# Patient Record
Sex: Female | Born: 1985 | Race: Black or African American | Hispanic: No | Marital: Single | Smoking: Former smoker
Health system: Southern US, Community
[De-identification: ages and names within clinical notes are randomized; demographics above are authoritative.]

## PROBLEM LIST (undated history)

## (undated) ENCOUNTER — Inpatient Hospital Stay (HOSPITAL_COMMUNITY): Payer: Self-pay

## (undated) DIAGNOSIS — N6009 Solitary cyst of unspecified breast: Secondary | ICD-10-CM

## (undated) DIAGNOSIS — L309 Dermatitis, unspecified: Secondary | ICD-10-CM

## (undated) DIAGNOSIS — F419 Anxiety disorder, unspecified: Secondary | ICD-10-CM

## (undated) DIAGNOSIS — R87629 Unspecified abnormal cytological findings in specimens from vagina: Secondary | ICD-10-CM

## (undated) HISTORY — DX: Solitary cyst of unspecified breast: N60.09

## (undated) HISTORY — DX: Unspecified abnormal cytological findings in specimens from vagina: R87.629

## (undated) HISTORY — PX: OTHER SURGICAL HISTORY: SHX169

---
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
## 2007-09-06 ENCOUNTER — Emergency Department (HOSPITAL_COMMUNITY): Admission: EM | Admit: 2007-09-06 | Discharge: 2007-09-06 | Payer: Self-pay | Admitting: Emergency Medicine

## 2008-02-12 HISTORY — PX: THERAPEUTIC ABORTION: SHX798

## 2008-09-10 ENCOUNTER — Emergency Department (HOSPITAL_COMMUNITY): Admission: EM | Admit: 2008-09-10 | Discharge: 2008-09-10 | Payer: Self-pay | Admitting: Emergency Medicine

## 2009-08-22 ENCOUNTER — Emergency Department (HOSPITAL_COMMUNITY): Admission: EM | Admit: 2009-08-22 | Discharge: 2009-08-23 | Payer: Self-pay | Admitting: Emergency Medicine

## 2009-11-02 ENCOUNTER — Inpatient Hospital Stay (HOSPITAL_COMMUNITY): Admission: AD | Admit: 2009-11-02 | Discharge: 2009-11-02 | Payer: Self-pay | Admitting: Obstetrics & Gynecology

## 2010-01-01 ENCOUNTER — Inpatient Hospital Stay (HOSPITAL_COMMUNITY)
Admission: AD | Admit: 2010-01-01 | Discharge: 2010-01-01 | Payer: Self-pay | Source: Home / Self Care | Admitting: Obstetrics and Gynecology

## 2010-01-30 ENCOUNTER — Ambulatory Visit (HOSPITAL_COMMUNITY): Admission: RE | Admit: 2010-01-30 | Payer: Self-pay | Source: Home / Self Care | Admitting: Obstetrics and Gynecology

## 2010-03-04 ENCOUNTER — Encounter: Payer: Self-pay | Admitting: Obstetrics and Gynecology

## 2010-04-07 ENCOUNTER — Inpatient Hospital Stay (HOSPITAL_COMMUNITY)
Admission: AD | Admit: 2010-04-07 | Discharge: 2010-04-07 | Disposition: A | Discharge status: Home / Self Care | Payer: Medicaid Other | Source: Ambulatory Visit | Attending: Obstetrics and Gynecology | Admitting: Obstetrics and Gynecology

## 2010-04-07 DIAGNOSIS — O99891 Other specified diseases and conditions complicating pregnancy: Secondary | ICD-10-CM | POA: Insufficient documentation

## 2010-04-07 DIAGNOSIS — G988 Other disorders of nervous system: Secondary | ICD-10-CM | POA: Insufficient documentation

## 2010-04-07 DIAGNOSIS — G5600 Carpal tunnel syndrome, unspecified upper limb: Secondary | ICD-10-CM | POA: Insufficient documentation

## 2010-04-15 ENCOUNTER — Inpatient Hospital Stay (HOSPITAL_COMMUNITY): Payer: Medicaid Other

## 2010-04-15 ENCOUNTER — Inpatient Hospital Stay (HOSPITAL_COMMUNITY)
Admission: RE | Admit: 2010-04-15 | Discharge: 2010-04-15 | Disposition: A | Discharge status: Home / Self Care | Payer: Medicaid Other | Source: Ambulatory Visit | Attending: Obstetrics and Gynecology | Admitting: Obstetrics and Gynecology

## 2010-04-15 DIAGNOSIS — O479 False labor, unspecified: Secondary | ICD-10-CM | POA: Insufficient documentation

## 2010-04-16 ENCOUNTER — Inpatient Hospital Stay (HOSPITAL_COMMUNITY)
Admission: AD | Admit: 2010-04-16 | Discharge: 2010-04-20 | DRG: 765 | Disposition: A | Discharge status: Home / Self Care | Payer: Medicaid Other | Source: Ambulatory Visit | Attending: Obstetrics and Gynecology | Admitting: Obstetrics and Gynecology

## 2010-04-16 DIAGNOSIS — D696 Thrombocytopenia, unspecified: Secondary | ICD-10-CM | POA: Diagnosis present

## 2010-04-16 DIAGNOSIS — O9902 Anemia complicating childbirth: Secondary | ICD-10-CM | POA: Diagnosis present

## 2010-04-16 DIAGNOSIS — D649 Anemia, unspecified: Secondary | ICD-10-CM | POA: Diagnosis present

## 2010-04-16 DIAGNOSIS — D689 Coagulation defect, unspecified: Secondary | ICD-10-CM | POA: Diagnosis present

## 2010-04-16 DIAGNOSIS — O99892 Other specified diseases and conditions complicating childbirth: Secondary | ICD-10-CM | POA: Diagnosis present

## 2010-04-16 DIAGNOSIS — D573 Sickle-cell trait: Secondary | ICD-10-CM | POA: Diagnosis present

## 2010-04-16 DIAGNOSIS — Z22330 Carrier of Group B streptococcus: Secondary | ICD-10-CM

## 2010-04-17 LAB — CBC
HCT: 31.6 % — ABNORMAL LOW (ref 36.0–46.0)
MCH: 28.6 pg (ref 26.0–34.0)
MCHC: 33.5 g/dL (ref 30.0–36.0)
MCV: 85.2 fL (ref 78.0–100.0)
RDW: 14.1 % (ref 11.5–15.5)
WBC: 6.8 10*3/uL (ref 4.0–10.5)

## 2010-04-18 LAB — CBC
MCH: 28.4 pg (ref 26.0–34.0)
MCHC: 33.2 g/dL (ref 30.0–36.0)
MCV: 85.6 fL (ref 78.0–100.0)
Platelets: 109 10*3/uL — ABNORMAL LOW (ref 150–400)
RBC: 2.64 MIL/uL — ABNORMAL LOW (ref 3.87–5.11)

## 2010-04-19 LAB — CBC
HCT: 21.9 % — ABNORMAL LOW (ref 36.0–46.0)
MCHC: 33.3 g/dL (ref 30.0–36.0)
RDW: 14.3 % (ref 11.5–15.5)

## 2010-04-24 LAB — CBC
MCH: 32.1 pg (ref 26.0–34.0)
MCV: 93.3 fL (ref 78.0–100.0)
Platelets: 154 10*3/uL (ref 150–400)
RDW: 14.2 % (ref 11.5–15.5)

## 2010-04-24 LAB — COMPREHENSIVE METABOLIC PANEL
Albumin: 3.2 g/dL — ABNORMAL LOW (ref 3.5–5.2)
BUN: 4 mg/dL — ABNORMAL LOW (ref 6–23)
Creatinine, Ser: 0.65 mg/dL (ref 0.4–1.2)
Total Protein: 6.5 g/dL (ref 6.0–8.3)

## 2010-04-24 LAB — DIFFERENTIAL
Basophils Absolute: 0 10*3/uL (ref 0.0–0.1)
Lymphocytes Relative: 11 % — ABNORMAL LOW (ref 12–46)
Monocytes Absolute: 0.6 10*3/uL (ref 0.1–1.0)
Monocytes Relative: 7 % (ref 3–12)
Neutro Abs: 6.5 10*3/uL (ref 1.7–7.7)

## 2010-04-24 LAB — URINALYSIS, ROUTINE W REFLEX MICROSCOPIC
Bilirubin Urine: NEGATIVE
Hgb urine dipstick: NEGATIVE
Protein, ur: NEGATIVE mg/dL
Urobilinogen, UA: 0.2 mg/dL (ref 0.0–1.0)

## 2010-04-26 LAB — ABO/RH: ABO/RH(D): O POS

## 2010-04-26 LAB — URINALYSIS, ROUTINE W REFLEX MICROSCOPIC
Bilirubin Urine: NEGATIVE
Hgb urine dipstick: NEGATIVE
Nitrite: NEGATIVE
Specific Gravity, Urine: 1.015 (ref 1.005–1.030)
pH: 6 (ref 5.0–8.0)

## 2010-04-28 NOTE — Op Note (Signed)
Emily Garrison, Emily Garrison          ACCOUNT NO.:  [id number]  MEDICAL RECORD NO.:  [id number]           PATIENT TYPE:  I  LOCATION:  9117                          FACILITY:  [facility]  PHYSICIAN:  Hal Morales, M.D.DATE OF BIRTH:  10/18/1985  DATE OF PROCEDURE:  04/17/2010 DATE OF DISCHARGE:                              OPERATIVE REPORT   PREOPERATIVE DIAGNOSIS:  Intrauterine pregnancy at 40-1/2 weeks, failure to progress in labor, meconium-stained amniotic fluid.  POSTOPERATIVE DIAGNOSES:  Intrauterine pregnancy at 40-1/2 weeks, failure to progress in labor, meconium-stained amniotic fluid plus occiput posterior position.  PROCEDURE:  Primary low transverse cesarean section.  SURGEON:  Hal Morales, MD  FIRST ASSISTANT:  Larna Daughters, CNM  ANESTHESIA:  Epidural.  ESTIMATED BLOOD LOSS:  750 mL.  COMPLICATIONS:  None.  FINDINGS:  The patient was delivered of a female infant weighing 7 pounds 15 ounces with Apgar's of 8 and 9 at 1 and 5 minutes respectively.  The placenta contained an eccentrically inserted 3-vessel cord.  The uterus, tubes, and ovaries were normal for gravid state.  PROCEDURE:  The patient was taken to the operating room after appropriate identification and after discussion of the indication for her procedure which is the failure to continue dilating during the active phase of labor in spite of adequate contractions.  The risks of anesthesia, bleeding, infection, and damage to adjacent organs were all reviewed and the patient wished to proceed.  She was placed on the operating table with her labor epidural and Foley catheter in place. The abdomen was prepped with ChloraPrep and after waiting the appropriate 3 minutes was draped as a sterile field.  After assurance of adequate surgical anesthesia, the suprapubic region was infiltrated with 20 mL of 0.25% Marcaine.  A suprapubic incision was made and the abdomen opened in layers.  Peritoneum  was entered and the bladder blade placed. The uterus was incised approximately 2 cm above the uterovesical fold and that incision taken laterally on either side bluntly.  The infant was delivered from the occiput posterior position and after having the cord clamped, cut and clamped was handed off to the awaiting pediatricians.  The uterus was massaged and the placenta allowed to separate from the uterus and was removed from the operative field for collection of blood for cord blood donation to [facility].  The uterine incision was closed with a running interlocking suture of 0 Vicryl and imbricating suture of 0 Vicryl was placed with adequate hemostasis.  Copious irrigation was carried out.  The abdominal peritoneum was closed with running suture of 2-0 Vicryl.  The rectus muscles were reapproximated in the midline with figure-of-eight suture of 2-0 Vicryl.  The rectus fascia was closed with running suture of 2-0 Vicryl and reinforced on either side of midline with figure-of-eight sutures of 0 Vicryl.  The subcutaneous tissue was irrigated and made hemostatic with Bovie cautery.  A subcuticular suture of 3-0 Monocryl was used to close the skin incision.  A sterile dressing was applied. The patient was taken from the operating room to the recovery room in satisfactory condition having tolerated the procedure well with sponge  and instrument counts correct.  The infant went to the full-term nursery.     Hal Morales, M.D.     VPH/MEDQ  D:  04/17/2010  T:  04/18/2010  Job:  [id number]  Electronically Signed by Dierdre Forth M.D. on 04/28/2010 07:23:10 PM

## 2010-04-29 LAB — URINALYSIS, ROUTINE W REFLEX MICROSCOPIC
Glucose, UA: NEGATIVE mg/dL
Nitrite: NEGATIVE
Protein, ur: NEGATIVE mg/dL

## 2010-04-29 LAB — HCG, QUANTITATIVE, PREGNANCY: hCG, Beta Chain, Quant, S: 97082 m[IU]/mL — ABNORMAL HIGH (ref <5)

## 2010-04-29 LAB — WET PREP, GENITAL

## 2010-04-29 LAB — POCT PREGNANCY, URINE: Preg Test, Ur: POSITIVE

## 2010-05-20 LAB — URINALYSIS, ROUTINE W REFLEX MICROSCOPIC
Nitrite: NEGATIVE
Protein, ur: NEGATIVE mg/dL
Specific Gravity, Urine: 1.01 (ref 1.005–1.030)
Urobilinogen, UA: 0.2 mg/dL (ref 0.0–1.0)

## 2010-05-20 LAB — CBC
Platelets: 223 10*3/uL (ref 150–400)
RBC: 3.54 MIL/uL — ABNORMAL LOW (ref 3.87–5.11)
WBC: 4.1 10*3/uL (ref 4.0–10.5)

## 2010-05-20 LAB — WET PREP, GENITAL
Trich, Wet Prep: NONE SEEN
WBC, Wet Prep HPF POC: NONE SEEN
Yeast Wet Prep HPF POC: NONE SEEN

## 2010-05-20 LAB — POCT PREGNANCY, URINE: Preg Test, Ur: NEGATIVE

## 2010-05-20 LAB — COMPREHENSIVE METABOLIC PANEL
ALT: 10 U/L (ref 0–35)
AST: 17 U/L (ref 0–37)
Albumin: 3.7 g/dL (ref 3.5–5.2)
CO2: 25 mEq/L (ref 19–32)
Chloride: 108 mEq/L (ref 96–112)
GFR calc Af Amer: >60 mL/min (ref >60)
GFR calc non Af Amer: >60 mL/min (ref >60)
Sodium: 137 mEq/L (ref 135–145)
Total Bilirubin: 0.6 mg/dL (ref 0.3–1.2)

## 2010-06-02 NOTE — Discharge Summary (Signed)
Emily Garrison, Emily Garrison          ACCOUNT NO.:  [id number]  MEDICAL RECORD NO.:  [id number]           PATIENT TYPE:  I  LOCATION:  9117                          [facility]:  [facility]  PHYSICIAN:  Crist Fat. Truc Winfree, M.D. DATE OF BIRTH:  Oct 09, 1985  DATE OF ADMISSION:  04/16/2010 DATE OF DISCHARGE:  04/20/2010                              DISCHARGE SUMMARY   HOSPITAL PROCEDURES: 1. Epidural anesthesia. 2. Primary low transverse cesarean section for failure to progress.  ADMITTING DIAGNOSES: 1. Intrauterine pregnancy at 40 weeks and 6 days. 2. Category 1 fetal heart tracing. 3. 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 4. Group B streptococcus positive in urine. 5. Smoker.  DISCHARGE DIAGNOSES: 1. Stable postoperative day #3 status post primary low transverse     cesarean section for failure to progress (occiput posterior     presentation). 2. Breast-feeding. 3. Persisting postpartum anemia without signs or symptoms. 4. Thrombocytopenia since admission. 5. Obese.  HOSPITAL COURSE:  [name] is a 25 year old gravida 2, para 0-0-1- 0 who presented on date of admission at 40 weeks and 6 days, initially to MAU, reporting strong uterine contractions times approximately 23 hours.  She was then seen in MAU previous night and vaginal exam was 2 cm, 50% effaced per Sanda Klein, certified nurse midwife and again in the office by Dr. Stefano Gaul, his exam was approximately 3 cm.  She reported good fetal movement.  Denied leakage of fluid, had been followed by the MD Service at [facility] with Dr. Stefano Gaul as her primary. Her pregnancy had been remarkable for as I mentioned: 1. 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 2. Late to care. 3. Smoker. 4. Anemia.  Around 11:15 p.m. on the night of April 16, 2010, fetal heart tracing was category 1, baseline 130s.  Toco showed uterine contractions every 4-8 minutes which were mild-to-moderate.  Vaginal exam 2-3  cm, 70% effaced, -3 station vertex.  No leakage of fluid or vaginal bleeding and exam was by Dorathy Kinsman, certified nurse midwife.  Diagnosed with prodrome versus early labor.  Initially offered recheck in 1-2 hours versus pain meds and discharged home, and the patient did request at that time pain medications.  She was to receive some Stadol and Phenergan IM and discharge home 20 minutes after medication if fetal heart tracing stable.  However, the patient's cervix did change on recheck around 2:25 a.m. on April 17, 2010.  Cervix was 4 cm, 80% effaced, -3, vertex, bulging bag of water with positive bloody show and per consult with Dr. Estanislado Pandy, the patient was then admitted to birthing suites with routine orders, penicillin for GBS and analgesia anesthesia p.r.n.  On the morning of April 17, 2010, the patient was seen by Dr. Pennie Rushing.  She complained of pain that was 9/10, but the patient appeared comfortable between contractions.  Contractions were every 2-5 minutes and Pitocin had been started and was at 4 milliunits per minute.  Reassuring fetal heart tracing.  Diagnosed with latent phase of labor and plan was made to recheck later in the morning for possible AROM.  Around 11:10 a.m., the patient had gotten  an epidural and was comfortable.  She was afebrile.  Vital signs stable.  Cervix was 3-4 cm, 80% effaced, -2 station and vertex.  Pitocin remained at 4 milliunits per minute. Artificial rupture of membranes was performed at that time by Dr. Dierdre Forth.  Light meconium stained fluid was noted and IUPC was inserted.  Around 1400 on the afternoon of April 17, 2010, the patient remained comfortable.  Cervix was 5-1/2 cm by nurse exam.  Pitocin continued as the evening progressed.  At approximately 1810, cervix was 6 cm, had been that way for the last 3 hours despite adequate contractions, measured by IUPC.  Cervix had become edematous and C- section was recommended by Dr. Pennie Rushing.   Risks, benefits, and alternatives were reviewed with the patient and family including but not limited to risk of bleeding, infection, and damage to surrounding organs, and anesthesia complications.  All questions were answered and the patient was agreeable to proceed with cesarean section for delivery secondary to failure to progress.  The patient was prepped and taken to the OR where a primary low transverse cesarean section was performed under epidural anesthesia by attending, Dr. Dierdre Forth, assisted by Larna Daughters, certified nurse midwife.  A viable female infant was delivered on April 17, 2010, at 1851 p.m. at 40 weeks and 6 days, direct OP presentation was noted.  Birth weight 7 pounds 15 ounces (3620 g, length 21.5 inches, and Apgars were 8 at 1 minute and 9 at 5 minutes).  Infant was taken to full-time nursery in stable condition and the patient tolerated the procedure well and was transferred to PACU and subsequently to the mother baby floor in stable condition.  On postop day #1 which was April 18, 2010, on morning rounds, the patient complained of being unable to void following Foley discontinuation.  No itching and no nausea, vomiting.  Vital signs were stable.  Physical exam was within normal limits.  Incision dressing was clean, dry, and intact.  Her CBC on postop day #1, white count was up to 10.9 from 6.8, hemoglobin down to 7.5 from 10.6, hematocrit down to 22.6 from 31.6, and platelets were down to 109 from 140 on admission.  She also had an RPR on April 17, 2010, on her date of admission which was non-reactive.   Orthostatic vital signs were ordered and continue to observe for spontaneous void.  Around 11 a.m. which was about a hour after seen by Dr. Normand Sloop, the patient was doing well.  Pain was well controlled. She was breast-feeding, positive flatus, still had not voided, but physical exam remained within normal limits.  Orthostatic vital signs were  done on April 18, 2010.  Supine blood pressure 124/73, heart rate 83, sitting blood pressure 131/71, heart rate 90, standing blood pressure 126/72, and heart rate was 94.  On postop day #2 which was April 19, 2010 on morning rounds, the patient was doing well.  She had been voiding well.  She was up ad lib without syncope.  Pain was well controlled, breast-feeding established, tolerating p.o.'s.  Vital signs remained stable.  Physical exam remained within normal limits.  Fundus was firm, 3 below U.  Bowel sounds present x4.  Dressing was clean, dry, and intact.  Platelets did rise to 123 on postoperative day #2 lab draw. Transfusion was discussed and given as option by Dr. Leonard Schwartz on postop day #2, and the patient declined and to proceed with p.o. iron supplement.  By postoperative day #3,  April 20, 2010, the patient was doing very well, was ready for discharge.  She reported vaginal bleeding only with "coughing and standing."  Breast-feeding was established and going well with good output; tolerating p.o.'s, voiding without difficulty, positive flatus.  No bowel movement since delivery. No dizziness or syncope.  Vital signs were stable, afebrile.  That morning, temperature was 97.9 and blood pressure 109/68.  Physical exam was within normal limits.  Breasts were soft and intact.  Abdomen soft, nontender.  Fundus was firm, U -1.  Incision was open to air, clean, dry, and intact.  Lochia scant rubra.  Extremities, 1+ generalized edema, bilateral lower extremity, negative Homans x2.  She was deemed to have received full benefit of her hospital stay and per consult with Dr. Silverio Lay, was discharged home in stable condition on postoperative day #3 which was April 20, 2010.  Discharge instructions were per [facility] pamphlet.  Warning signs and symptoms to report were reviewed. Postpartum depression pamphlet was also given as well as an iron rich food sheet.  DISCHARGE  MEDICATIONS: 1. Motrin 600 mg p.o. q.6 hours p.r.n. pain. 2. Percocet 5/325 one tab p.o. q.3 hours or 2 tabs p.o. q.6 hours     p.r.n. moderate-to-severe pain. 3. The patient to obtain over-the-counter Colace 1-3 tabs p.o. daily     while on Percocet. 4. Continue prenatal vitamin 1 tab p.o. daily. 5. MiraLax to be used p.r.n. if no bowel movement at least every 3     days and,   Discussed other iron supplements.  Discharge followup is to occur at 6 weeks at [facility] or p.r.n.    Micronor prescription was also given and the patient to start in approximately 1 month for birth control.     Candice Weinert, [place]   ______________________________ Crist Fat Krishav Mamone, M.D.    CHS/MEDQ  D:  05/19/2010  T:  05/19/2010  Job:  [id number]  Electronically Signed by Carolanne Grumbling CNM on 05/22/2010 10:08:23 PM Electronically Signed by Silverio Lay M.D. on 06/02/2010 10:41:25 AM

## 2010-08-17 NOTE — H&P (Signed)
  [name], [name]:  [id number]  MEDICAL RECORD NO.:  [id number]          PATIENT TYPE:  OUT  LOCATION:  MFM                           [facility]:  [facility]  PHYSICIAN:  Janine Limbo, M.D.DATE OF BIRTH:  1985-04-02  DATE OF ADMISSION:  04/18/2010 DATE OF DISCHARGE:                             HISTORY & PHYSICAL   HISTORY OF PRESENT ILLNESS:  Emily Garrison is a 25 year old female, gravida 2, para 0-0-1-0, who presents at 41 weeks and 1 day gestation (EDC is April 11, 2010).  The patient has been followed at the [facility].  The pregnancy has been complicated by sickle cell trait.  Her urinalysis have been normal.  The father of the baby also has sickle cell trait.  The patient is a past cigarette smoker.  OBSTETRICAL HISTORY:  The patient had an elective pregnancy termination in 2010.  DRUG ALLERGIES:  No known drug allergies.  PAST MEDICAL HISTORY:  Please see history of present illness.  The patient had a left breast biopsy in 2007.  She has a past history of anemia.  SOCIAL HISTORY:  The patient has a history of occasional cigarette use. She denies alcohol and other drug uses.  REVIEW OF SYSTEMS:  The patient complains of pressure associated with her pregnancy.  FAMILY HISTORY:  Noncontributory.  PHYSICAL EXAMINATION:  VITAL SIGNS:  Weight is 237 pounds. HEENT:  Within normal limits. CHEST:  Clear. HEART:  Regular rate and rhythm. BREASTS:  Without masses. ABDOMEN:  Gravid with a fundal height of 39 cm. EXTREMITIES:  Grossly normal. NEUROLOGIC:  Grossly normal. PELVIC:  Cervix is 1-2 cm dilated, 50% effaced, and -3 in station.  ASSESSMENT: 1. A 41-week and 1-day gestation. 2. The patient and father of baby are positive for sickle cell trait. 3. Cigarette smoker.  PLAN:  The patient will be admitted for Cytotec induction.  We will begin Pitocin on the  morning of April 19, 2010.  LABORATORY VALUES:  Blood type is O positive, antibody screen negative, VDRL nonreactive, rubella immune, HbsAg negative, HIV nonreactive, third trimester gonorrhea negative, third trimester Chlamydia negative, third trimester beta strep negative, sickle cell trait is positive.  The patient presented late to care, so we do not have any genetic screening.     Janine Limbo, M.D.     AVS/MEDQ  D:  04/10/2010  T:  04/11/2010  Job:  [id number]  Electronically Signed by Kirkland Hun M.D. on 04/24/2010 11:08:20 AM MedRecNo: [id number] [facility], Account: [id number], DocSeq: [id number] Paient presented in labor prior to the planned induction. Electronically Signed by Kirkland Hun M.D. on 04/24/2010 11:08:07 AM

## 2010-11-09 LAB — URINALYSIS, ROUTINE W REFLEX MICROSCOPIC
Bilirubin Urine: NEGATIVE
Ketones, ur: NEGATIVE
Nitrite: NEGATIVE
Specific Gravity, Urine: 1.019
Urobilinogen, UA: 0.2
pH: 7.5

## 2010-11-09 LAB — RAPID STREP SCREEN (MED CTR MEBANE ONLY): Streptococcus, Group A Screen (Direct): NEGATIVE

## 2011-02-25 ENCOUNTER — Encounter (HOSPITAL_COMMUNITY): Payer: Self-pay | Admitting: Emergency Medicine

## 2011-02-25 ENCOUNTER — Emergency Department (HOSPITAL_COMMUNITY)
Admission: EM | Admit: 2011-02-25 | Discharge: 2011-02-25 | Disposition: A | Discharge status: Home / Self Care | Payer: Self-pay | Attending: Emergency Medicine | Admitting: Emergency Medicine

## 2011-02-25 DIAGNOSIS — B9689 Other specified bacterial agents as the cause of diseases classified elsewhere: Secondary | ICD-10-CM | POA: Insufficient documentation

## 2011-02-25 DIAGNOSIS — A499 Bacterial infection, unspecified: Secondary | ICD-10-CM | POA: Insufficient documentation

## 2011-02-25 DIAGNOSIS — N760 Acute vaginitis: Secondary | ICD-10-CM | POA: Insufficient documentation

## 2011-02-25 DIAGNOSIS — F17200 Nicotine dependence, unspecified, uncomplicated: Secondary | ICD-10-CM | POA: Insufficient documentation

## 2011-02-25 LAB — URINE CULTURE
Colony Count: 45000
Culture  Setup Time: 201301142344

## 2011-02-25 LAB — URINALYSIS, ROUTINE W REFLEX MICROSCOPIC
Glucose, UA: NEGATIVE mg/dL
Hgb urine dipstick: NEGATIVE
Specific Gravity, Urine: 1.016 (ref 1.005–1.030)
Urobilinogen, UA: 0.2 mg/dL (ref 0.0–1.0)

## 2011-02-25 LAB — WET PREP, GENITAL
Trich, Wet Prep: NONE SEEN
Yeast Wet Prep HPF POC: NONE SEEN

## 2011-02-25 LAB — POCT PREGNANCY, URINE: Preg Test, Ur: NEGATIVE

## 2011-02-25 LAB — URINE MICROSCOPIC-ADD ON

## 2011-02-25 MED ORDER — METRONIDAZOLE 500 MG PO TABS: 500.0000 mg | ORAL_TABLET | Freq: Two times a day (BID) | ORAL | Status: AC

## 2011-02-25 NOTE — ED Notes (Signed)
PT. REPORTS VAGINAL DISCHARGE ONSET TODAY ,  DENIES VAGINAL BLEEDING , DENIES ANY PAIN OR CRAMPING .

## 2011-02-25 NOTE — ED Provider Notes (Signed)
History     CSN: [id number]  Arrival date & time 02/25/11  1742   First MD Initiated Contact with Patient 02/25/11 2122      Chief Complaint  Patient presents with  . Vaginal Discharge     Patient is a 26 y.o. female presenting with vaginal discharge. The history is provided by the patient.  Vaginal Discharge This is a chronic problem. The current episode started more than 1 month ago. The problem occurs constantly. The problem has been gradually worsening. Pertinent negatives include no abdominal pain, fever, nausea, rash or urinary symptoms.  Pt reports since having her child approx 10 mo ago she has had intermittent problems w/ persistent vag d/c. D/c is sometimes clear and sometimes reddish/brown in color. Denies sexual activity since vag del 10 mo ago. Denies abd pain or other associated sx's.  History reviewed. No pertinent past medical history.  Past Surgical History  Procedure Date  . Cesarean section     No family history on file.  History  Substance Use Topics  . Smoking status: Current Everyday Smoker  . Smokeless tobacco: Not on file  . Alcohol Use: Yes    OB History    Grav Para Term Preterm Abortions TAB SAB Ect Mult Living                  Review of Systems  Constitutional: Negative.  Negative for fever.  HENT: Negative.   Eyes: Negative.   Respiratory: Negative.   Cardiovascular: Negative.   Gastrointestinal: Negative.  Negative for nausea and abdominal pain.  Genitourinary: Positive for vaginal discharge.  Musculoskeletal: Negative.   Skin: Negative.  Negative for rash.  Neurological: Negative.   Hematological: Negative.   Psychiatric/Behavioral: Negative.     Allergies  Latex  Home Medications   Current Outpatient Rx  Name Route Sig Dispense Refill  . IRON PO Oral Take 1 tablet by mouth daily.    Marland Kitchen LEVONORGEST-ETH ESTRAD 91-DAY 0.15-0.03 MG PO TABS Oral Take 1 tablet by mouth daily.      BP 110/69  Pulse 94  Temp(Src) 98.2 F  (36.8 C) (Oral)  Resp 18  SpO2 95%  LMP 01/23/2011  Physical Exam  Constitutional: She is oriented to person, place, and time. She appears well-developed and well-nourished.  HENT:  Head: Normocephalic and atraumatic.  Eyes: Conjunctivae are normal.  Neck: Neck supple.  Cardiovascular: Normal rate and regular rhythm.   Pulmonary/Chest: Effort normal and breath sounds normal.  Abdominal: Soft. Bowel sounds are normal.  Genitourinary: Pelvic exam was performed with patient supine. There is no rash, tenderness, lesion or injury on the right labia. There is no rash, tenderness, lesion or injury on the left labia. No erythema, tenderness or bleeding around the vagina. No foreign body around the vagina. No signs of injury around the vagina. Vaginal discharge found.       Creamy white d/c.  Musculoskeletal: Normal range of motion.  Neurological: She is alert and oriented to person, place, and time.  Skin: Skin is warm and dry. No erythema.  Psychiatric: She has a normal mood and affect.    ED Course  Procedures Findings and clinical impression discussed w/ pt. Will plan for d/c home w/ tx for BV and encourage f/u w/ GYN as planned.  Pt agreeable w/ plan Labs Reviewed  URINALYSIS, ROUTINE W REFLEX MICROSCOPIC - Abnormal; Notable for the following:    APPearance CLOUDY (*)    Leukocytes, UA SMALL (*)    All other  components within normal limits  URINE MICROSCOPIC-ADD ON - Abnormal; Notable for the following:    Squamous Epithelial / LPF MANY (*)    Bacteria, UA MANY (*)    All other components within normal limits  WET PREP, GENITAL - Abnormal; Notable for the following:    Clue Cells, Wet Prep MODERATE (*)    WBC, Wet Prep HPF POC FEW (*)    All other components within normal limits  POCT PREGNANCY, URINE  POCT PREGNANCY, URINE  GC/CHLAMYDIA PROBE AMP, GENITAL   No results found.   No diagnosis found.    MDM  HPI/PE and clinical findings c/w BV No other abnormal  findings.      Leanne Chang, NP 02/27/11 1945

## 2011-02-25 NOTE — ED Notes (Signed)
Pt was informed about urine culture being sent and  The ED will call pt, if needed.

## 2011-02-25 NOTE — ED Notes (Signed)
Called lab to add on and sent requisition

## 2011-02-25 NOTE — ED Notes (Addendum)
Had baby 10 months ago. Pt stated that she has been having intermittent vaginal discharge since last April. Discharge is sometimes white and sometimes brown. Pt stated there is no pain, itching, or burning. No urinary frequency. No nausea vomiting. Will continue to monitor.

## 2011-02-27 LAB — GC/CHLAMYDIA PROBE AMP, GENITAL
Chlamydia, DNA Probe: NEGATIVE
GC Probe Amp, Genital: NEGATIVE

## 2011-02-27 NOTE — ED Provider Notes (Signed)
Medical screening examination/treatment/procedure(s) were performed by non-physician practitioner and as supervising physician I was immediately available for consultation/collaboration.   Joliana Claflin, MD 02/27/11 2028 

## 2011-04-24 ENCOUNTER — Emergency Department (HOSPITAL_COMMUNITY)
Admission: EM | Admit: 2011-04-24 | Discharge: 2011-04-25 | Disposition: A | Discharge status: Home / Self Care | Payer: Self-pay | Attending: Emergency Medicine | Admitting: Emergency Medicine

## 2011-04-24 ENCOUNTER — Encounter (HOSPITAL_COMMUNITY): Payer: Self-pay | Admitting: Emergency Medicine

## 2011-04-24 DIAGNOSIS — F17200 Nicotine dependence, unspecified, uncomplicated: Secondary | ICD-10-CM | POA: Insufficient documentation

## 2011-04-24 DIAGNOSIS — A64 Unspecified sexually transmitted disease: Secondary | ICD-10-CM | POA: Insufficient documentation

## 2011-04-24 DIAGNOSIS — N390 Urinary tract infection, site not specified: Secondary | ICD-10-CM | POA: Insufficient documentation

## 2011-04-24 DIAGNOSIS — N898 Other specified noninflammatory disorders of vagina: Secondary | ICD-10-CM | POA: Insufficient documentation

## 2011-04-24 DIAGNOSIS — R109 Unspecified abdominal pain: Secondary | ICD-10-CM | POA: Insufficient documentation

## 2011-04-24 LAB — WET PREP, GENITAL: Yeast Wet Prep HPF POC: NONE SEEN

## 2011-04-24 LAB — URINE MICROSCOPIC-ADD ON

## 2011-04-24 LAB — URINALYSIS, ROUTINE W REFLEX MICROSCOPIC
Bilirubin Urine: NEGATIVE
Ketones, ur: NEGATIVE mg/dL
Nitrite: NEGATIVE
pH: 6 (ref 5.0–8.0)

## 2011-04-24 MED ORDER — NITROFURANTOIN MONOHYD MACRO 100 MG PO CAPS: 100.0000 mg | ORAL_CAPSULE | Freq: Two times a day (BID) | ORAL | Status: AC

## 2011-04-24 MED ORDER — CEFTRIAXONE SODIUM 250 MG IJ SOLR
250.0000 mg | Freq: Once | INTRAMUSCULAR | Status: AC
  Administered 2011-04-25: 250 mg via INTRAMUSCULAR
  Filled 2011-04-24: qty 250

## 2011-04-24 MED ORDER — AZITHROMYCIN 250 MG PO TABS
1000.0000 mg | ORAL_TABLET | Freq: Once | ORAL | Status: AC
  Administered 2011-04-25: 1000 mg via ORAL
  Filled 2011-04-24: qty 4

## 2011-04-24 MED ORDER — METRONIDAZOLE 500 MG PO TABS
2000.0000 mg | ORAL_TABLET | Freq: Once | ORAL | Status: AC
  Administered 2011-04-25: 2000 mg via ORAL
  Filled 2011-04-24: qty 4

## 2011-04-24 NOTE — Discharge Instructions (Signed)
You were seen and evaluated today for your complaints of vaginal discharge and abdominal discomfort. Your urine sample showed some slight signs for urinary tract infection providers today also discussed with you treatment for possible sexually transmitted disease. You should discuss this with your partner for evaluation and treatment as well. Please followup with the [facility] health Department sexually transmitted disease clinic for further testing.   Urinary Tract Infection Infections of the urinary tract can start in several places. A bladder infection (cystitis), a kidney infection (pyelonephritis), and a prostate infection (prostatitis) are different types of urinary tract infections (UTIs). They usually get better if treated with medicines (antibiotics) that kill germs. Take all the medicine until it is gone. You or your child may feel better in a few days, but TAKE ALL MEDICINE or the infection may not respond and may become more difficult to treat. HOME CARE INSTRUCTIONS   Drink enough water and fluids to keep the urine clear or pale yellow. Cranberry juice is especially recommended, in addition to large amounts of water.   Avoid caffeine, tea, and carbonated beverages. They tend to irritate the bladder.   Alcohol may irritate the prostate.   Only take over-the-counter or prescription medicines for pain, discomfort, or fever as directed by your caregiver.  To prevent further infections:  Empty the bladder often. Avoid holding urine for long periods of time.   After a bowel movement, women should cleanse from front to back. Use each tissue only once.   Empty the bladder before and after sexual intercourse.  FINDING OUT THE RESULTS OF YOUR TEST Not all test results are available during your visit. If your or your child's test results are not back during the visit, make an appointment with your caregiver to find out the results. Do not assume everything is normal if you have not  heard from your caregiver or the medical facility. It is important for you to follow up on all test results. SEEK MEDICAL CARE IF:   There is back pain.   Your baby is older than 3 months with a rectal temperature of 100.5 F (38.1 C) or higher for more than 1 day.   Your or your child's problems (symptoms) are no better in 3 days. Return sooner if you or your child is getting worse.  SEEK IMMEDIATE MEDICAL CARE IF:   There is severe back pain or lower abdominal pain.   You or your child develops chills.   You have a fever.   Your baby is older than 3 months with a rectal temperature of 102 F (38.9 C) or higher.   Your baby is 104 months old or younger with a rectal temperature of 100.4 F (38 C) or higher.   There is nausea or vomiting.   There is continued burning or discomfort with urination.  MAKE SURE YOU:   Understand these instructions.   Will watch your condition.   Will get help right away if you are not doing well or get worse.  Document Released: 11/07/2004 Document Revised: 01/17/2011 Document Reviewed: 06/12/2006 [facility] Patient Information 2012 [place], [place].   Sexually Transmitted Disease Sexually transmitted disease (STD) refers to any infection that is passed from person to person during sexual activity. This may happen by way of saliva, semen, blood, vaginal mucus, or urine. Common STDs include:  Gonorrhea.   Chlamydia.   Syphilis.   HIV/AIDS.   Genital herpes.   Hepatitis B and C.   Trichomonas.   Human papillomavirus (HPV).  Pubic lice.  CAUSES  An STD may be spread by bacteria, virus, or parasite. A person can get an STD by:  Sexual intercourse with an infected person.   Sharing sex toys with an infected person.   Sharing needles with an infected person.   Having intimate contact with the genitals, mouth, or rectal areas of an infected person.  SYMPTOMS  Some people may not have any symptoms, but they can still pass the  infection to others. Different STDs have different symptoms. Symptoms include:  Painful or bloody urination.   Pain in the pelvis, abdomen, vagina, anus, throat, or eyes.   Skin rash, itching, irritation, growths, or sores (lesions). These usually occur in the genital or anal area.   Abnormal vaginal discharge.   Penile discharge in men.   Soft, flesh-colored skin growths in the genital or anal area.   Fever.   Pain or bleeding during sexual intercourse.   Swollen glands in the groin area.   Yellow skin and eyes (jaundice). This is seen with hepatitis.  DIAGNOSIS  To make a diagnosis, your caregiver may:  Take a medical history.   Perform a physical exam.   Take a specimen (culture) to be examined.   Examine a sample of discharge under a microscope.   Perform blood tests.   Perform a Pap test, if this applies.   Perform a colposcopy.   Perform a laparoscopy.  TREATMENT   Chlamydia, gonorrhea, trichomonas, and syphilis can be cured with antibiotic medicine.   Genital herpes, hepatitis, and HIV can be treated, but not cured, with prescribed medicines. The medicines will lessen the symptoms.   Genital warts from HPV can be treated with medicine or by freezing, burning (electrocautery), or surgery. Warts may come back.   HPV is a virus and cannot be cured with medicine or surgery.However, abnormal areas may be followed very closely by your caregiver and may be removed from the cervix, vagina, or vulva through office procedures or surgery.  If your diagnosis is confirmed, your recent sexual partners need treatment. This is true even if they are symptom-free or have a negative culture or evaluation. They should not have sex until their caregiver says it is okay. HOME CARE INSTRUCTIONS  All sexual partners should be informed, tested, and treated for all STDs.   Take your antibiotics as directed. Finish them even if you start to feel better.   Only take  over-the-counter or prescription medicines for pain, discomfort, or fever as directed by your caregiver.   Rest.   Eat a balanced diet and drink enough fluids to keep your urine clear or pale yellow.   Do not have sex until treatment is completed and you have followed up with your caregiver. STDs should be checked after treatment.   Keep all follow-up appointments, Pap tests, and blood tests as directed by your caregiver.   Only use latex condoms and water-soluble lubricants during sexual activity. Do not use petroleum jelly or oils.   Avoid alcohol and illegal drugs.   Get vaccinated for HPV and hepatitis. If you have not received these vaccines in the past, talk to your caregiver about whether one or both might be right for you.   Avoid risky sex practices that can break the skin.  The only way to avoid getting an STD is to avoid all sexual activity.Latex condoms and dental dams (for oral sex) will help lessen the risk of getting an STD, but will not completely eliminate the risk. SEEK  MEDICAL CARE IF:   You have a fever.   You have any new or worsening symptoms.  Document Released: 04/20/2002 Document Revised: 01/17/2011 Document Reviewed: 04/27/2010 [facility] Patient Information 2012 [place], [place].    RESOURCE GUIDE  Dental Problems  Patients with Medicaid: [facility] [phone] [address]                                           [phone] [address] Phone:  [phone]                                                  Phone:  [phone]  If unable to pay or uninsured, contact:  [facility] or [facility]. to become qualified for the adult dental clinic.  Chronic Pain Problems Contact [facility]  [phone] Patients need to be referred by their primary care doctor.  Insufficient Money for Medicine Contact United Way:  call "211" or [facility] [phone].  No Primary Care  Doctor Call [facility]  [phone] Other agencies that provide inexpensive medical care    [facility]  [phone]    [facility]  [phone]    [facility]  [phone]    [facility]  [phone]    [facility]  [phone]    [facility]  [phone]  Psychological Services [facility]  [phone] [facility] Services  [phone] [facility]   [phone] (emergency services ([phone])  Substance Abuse Resources [facility]  [phone] [facility] [phone] [facility] [phone] Floydene Flock [phone] Residential & Outpatient Substance Abuse Program  [phone]  Abuse/Neglect [facility] Child Abuse Hotline 5090016086 [facility] Child Abuse Hotline (248) 834-2618 (After Hours)  Emergency Shelter [facility] [phone]  Maternity Homes [facility] [place] [facility] [phone] [facility] 7372983585  MRSA Hotline #:   615-646-8243    [facility] Resources  [facility]     [employer]                          [facility] Dept. [address] [place]                       [address]      [address]  [place]                                                Cristobal Goldmann Phone:  [phone]  Phone:  [phone]                 Phone:  [phone]  [facility] Phone:  [phone]  [place] Child Abuse Hotline (336) 342-1394 [phone] (After Hours)   

## 2011-04-24 NOTE — ED Provider Notes (Signed)
History     CSN: [id number]  Arrival date & time 04/24/11  1758   First MD Initiated Contact with Patient 04/24/11 2140      Chief Complaint  Patient presents with  . Abdominal Pain     HPI  History provided by the patient. Patient is a 26 year old female with history of cesarean section who presents with complaints of vaginal discharge and lower abdominal discomfort. Patient reports gradual increase of symptoms over the last 3 days. Symptoms are similar to previous episodes of vaginal discharge. Patient states she was seen for those in January and diagnosed with bacterial vaginosis. Symptoms have now returned. Patient denies any history of STDs. Patient does have unprotected sex with the same partner. Pain in abdomen has been waxing and waning some cramping. It is described as mild/moderate. Pain radiates into low back. Patient also reports associated dysuria and urinary frequency at times. She denies flank pain, fever, chills, sweats, nausea or vomiting.    History reviewed. No pertinent past medical history.  Past Surgical History  Procedure Date  . Cesarean section     No family history on file.  History  Substance Use Topics  . Smoking status: Current Everyday Smoker  . Smokeless tobacco: Not on file  . Alcohol Use: Yes    OB History    Grav Para Term Preterm Abortions TAB SAB Ect Mult Living                  Review of Systems  Constitutional: Negative for fever, chills and appetite change.  Gastrointestinal: Positive for abdominal pain. Negative for nausea, vomiting, diarrhea and constipation.  Genitourinary: Positive for dysuria, frequency, vaginal discharge and pelvic pain. Negative for hematuria, flank pain and vaginal bleeding.  All other systems reviewed and are negative.    Allergies  Latex  Home Medications   Current Outpatient Rx  Name Route Sig Dispense Refill  . LEVONORGEST-ETH ESTRAD 91-DAY 0.15-0.03 MG PO TABS Oral Take 1 tablet by mouth  daily.      BP 133/54  Pulse 79  Temp(Src) 98.4 F (36.9 C) (Oral)  Resp 18  SpO2 100%  Physical Exam  Nursing note and vitals reviewed. Constitutional: She is oriented to person, place, and time. She appears well-developed and well-nourished. No distress.  HENT:  Head: Normocephalic.  Cardiovascular: Normal rate and regular rhythm.   Pulmonary/Chest: Effort normal and breath sounds normal. She has no wheezes. She has no rales.  Abdominal: Soft. She exhibits no distension. There is tenderness in the suprapubic area. There is no rebound, no guarding, no CVA tenderness, no tenderness at McBurney's point and negative Murphy's sign.       Mild suprapubic tenderness  Genitourinary: Cervix exhibits discharge. Cervix exhibits no motion tenderness and no friability. Right adnexum displays no mass, no tenderness and no fullness. Left adnexum displays no tenderness and no fullness.       Chaperone was present. Moderate amount of clear to yellowish cervical discharge.  Neurological: She is alert and oriented to person, place, and time.  Skin: Skin is warm and dry. No rash noted.  Psychiatric: She has a normal mood and affect. Her behavior is normal.    ED Course  Procedures   Results for orders placed during the hospital encounter of 04/24/11  URINALYSIS, ROUTINE W REFLEX MICROSCOPIC      Component Value Range   Color, Urine YELLOW  YELLOW    APPearance CLOUDY (*) CLEAR    Specific Gravity, Urine 1.017  1.005 - 1.030    pH 6.0  5.0 - 8.0    Glucose, UA NEGATIVE  NEGATIVE (mg/dL)   Hgb urine dipstick MODERATE (*) NEGATIVE    Bilirubin Urine NEGATIVE  NEGATIVE    Ketones, ur NEGATIVE  NEGATIVE (mg/dL)   Protein, ur NEGATIVE  NEGATIVE (mg/dL)   Urobilinogen, UA 1.0  0.0 - 1.0 (mg/dL)   Nitrite NEGATIVE  NEGATIVE    Leukocytes, UA SMALL (*) NEGATIVE   URINE MICROSCOPIC-ADD ON      Component Value Range   Squamous Epithelial / LPF MANY (*) RARE    WBC, UA 3-6  <3 (WBC/hpf)   RBC / HPF  0-2  <3 (RBC/hpf)   Bacteria, UA FEW (*) RARE   WET PREP, GENITAL      Component Value Range   Yeast Wet Prep HPF POC NONE SEEN  NONE SEEN    Trich, Wet Prep NONE SEEN  NONE SEEN    Clue Cells Wet Prep HPF POC MODERATE (*) NONE SEEN    WBC, Wet Prep HPF POC MODERATE (*) NONE SEEN        1. Vaginal Discharge   2. Sexually transmitted disease   3. UTI (lower urinary tract infection)       MDM  9:55 PM patient seen and evaluated. Patient no acute distress.   I discussed with pt's my clinical concerns for possible STD given characteristics of vaginal discharge.  At this time she would like treatment for possible infection and will inform her partner.  She will follow with results from tests today and [facility].    Angus Seller, [place] 04/25/11 [phone]

## 2011-04-24 NOTE — ED Notes (Signed)
Onset 3 days ago abdominal pain LLQ and RLQ with bilateral flank pain. Pain currently 6/10 dull pain continued today. Denies urinary complaints does have  Vaginal discharge.

## 2011-04-24 NOTE — ED Notes (Signed)
Pt c/o dull achy pain in bilateral lower abdominal region and lower back, with vaginal discharge that is described as yellow Tanor Glaspy sometimes thick and sometimes thin. Last BM 3 days ago.

## 2011-04-25 LAB — GC/CHLAMYDIA PROBE AMP, GENITAL
Chlamydia, DNA Probe: NEGATIVE
GC Probe Amp, Genital: NEGATIVE

## 2011-04-25 NOTE — ED Provider Notes (Signed)
Medical screening examination/treatment/procedure(s) were performed by non-physician practitioner and as supervising physician I was immediately available for consultation/collaboration.   Khalfani Weideman A Ryett Hamman, MD 04/25/11 2332 

## 2011-10-07 ENCOUNTER — Encounter (HOSPITAL_COMMUNITY): Payer: Self-pay | Admitting: Family Medicine

## 2011-10-07 ENCOUNTER — Emergency Department (HOSPITAL_COMMUNITY)
Admission: EM | Admit: 2011-10-07 | Discharge: 2011-10-07 | Disposition: A | Discharge status: Home / Self Care | Payer: Self-pay | Attending: Emergency Medicine | Admitting: Emergency Medicine

## 2011-10-07 DIAGNOSIS — F17200 Nicotine dependence, unspecified, uncomplicated: Secondary | ICD-10-CM | POA: Insufficient documentation

## 2011-10-07 DIAGNOSIS — N760 Acute vaginitis: Secondary | ICD-10-CM | POA: Insufficient documentation

## 2011-10-07 LAB — URINALYSIS, ROUTINE W REFLEX MICROSCOPIC
Bilirubin Urine: NEGATIVE
Glucose, UA: NEGATIVE mg/dL
Hgb urine dipstick: NEGATIVE
Ketones, ur: NEGATIVE mg/dL
pH: 6 (ref 5.0–8.0)

## 2011-10-07 LAB — WET PREP, GENITAL: Trich, Wet Prep: NONE SEEN

## 2011-10-07 MED ORDER — FLUCONAZOLE 100 MG PO TABS
150.0000 mg | ORAL_TABLET | Freq: Once | ORAL | Status: AC
  Administered 2011-10-07: 150 mg via ORAL
  Filled 2011-10-07: qty 2

## 2011-10-07 MED ORDER — LIDOCAINE HCL (PF) 1 % IJ SOLN
INTRAMUSCULAR | Status: AC
  Administered 2011-10-07: 1.8 mL
  Filled 2011-10-07: qty 5

## 2011-10-07 MED ORDER — AZITHROMYCIN 250 MG PO TABS
1000.0000 mg | ORAL_TABLET | Freq: Once | ORAL | Status: AC
  Administered 2011-10-07: 1000 mg via ORAL
  Filled 2011-10-07: qty 4

## 2011-10-07 MED ORDER — CEFTRIAXONE SODIUM 250 MG IJ SOLR
250.0000 mg | Freq: Once | INTRAMUSCULAR | Status: AC
  Administered 2011-10-07: 250 mg via INTRAMUSCULAR
  Filled 2011-10-07: qty 250

## 2011-10-07 NOTE — ED Notes (Signed)
C/o urinary frequency, dysuria x 1 week. Denies [pain presently, "only with urinatuion"

## 2011-10-07 NOTE — ED Notes (Signed)
Pt sts she believes she has a UTI. sts burning with urination and frequent uriantion

## 2011-10-07 NOTE — ED Provider Notes (Addendum)
History   Scribed for Gwyneth Sprout, MD, the patient was seen in room TR05C/TR05C . This chart was scribed by Lewanda Rife.   CSN: [id number]  Arrival date & time 10/07/11  1657   First MD Initiated Contact with Patient 10/07/11 1738      Chief Complaint  Patient presents with  . Dysuria  . Polyuria    (Consider location/radiation/quality/duration/timing/severity/associated sxs/prior treatment) HPI Emily Garrison is a 26 y.o. female who presents to the Emergency Department complaining of waxing and waning mild polyuria and dysuria for the past 7 days. Pt reports mild white discharge without foul odor. Pt denies abdominal pain, and fever. Pt reports she started using a new body soap. Pt states she takes seasonique for birth control and has her period every 12 weeks.   History reviewed. No pertinent past medical history.  Past Surgical History  Procedure Date  . Cesarean section     No family history on file.  History  Substance Use Topics  . Smoking status: Current Everyday Smoker  . Smokeless tobacco: Not on file  . Alcohol Use: Yes    OB History    Grav Para Term Preterm Abortions TAB SAB Ect Mult Living                  Review of Systems  Constitutional: Negative for fever.  Gastrointestinal: Negative for abdominal pain.  Genitourinary: Positive for dysuria, frequency and vaginal discharge. Negative for pelvic pain.  All other systems reviewed and are negative.    Allergies  Latex  Home Medications   Current Outpatient Rx  Name Route Sig Dispense Refill  . LEVONORGEST-ETH ESTRAD 91-DAY 0.15-0.03 &0.01 MG PO TABS Oral Take 1 tablet by mouth daily.      BP 125/66  Pulse 68  Temp 98.5 F (36.9 C) (Oral)  Resp 15  SpO2 98%  Physical Exam  Nursing note and vitals reviewed. Constitutional: She is oriented to person, place, and time. She appears well-developed and well-nourished.  HENT:  Head: Normocephalic.  Eyes: Pupils are equal,  round, and reactive to light.  Neck: Normal range of motion.  Cardiovascular: Normal rate.   Pulmonary/Chest: Effort normal.  Abdominal: Soft.  Genitourinary: Rectum normal and uterus normal. There is no rash or tenderness on the right labia. There is no rash or tenderness on the left labia. Uterus is not tender. Cervix exhibits discharge. Cervix exhibits no motion tenderness. Right adnexum displays no mass and no tenderness. Left adnexum displays no mass and no tenderness. No tenderness around the vagina. Vaginal discharge (copious odorless yellow discharge) found.  Musculoskeletal: Normal range of motion.  Neurological: She is alert and oriented to person, place, and time.  Skin: Skin is warm and dry.  Psychiatric: She has a normal mood and affect.    ED Course  Procedures (including critical care time)   Results for orders placed during the hospital encounter of 10/07/11  URINALYSIS, ROUTINE W REFLEX MICROSCOPIC      Component Value Range   Color, Urine YELLOW  YELLOW   APPearance CLEAR  CLEAR   Specific Gravity, Urine 1.014  1.005 - 1.030   pH 6.0  5.0 - 8.0   Glucose, UA NEGATIVE  NEGATIVE mg/dL   Hgb urine dipstick NEGATIVE  NEGATIVE   Bilirubin Urine NEGATIVE  NEGATIVE   Ketones, ur NEGATIVE  NEGATIVE mg/dL   Protein, ur NEGATIVE  NEGATIVE mg/dL   Urobilinogen, UA 0.2  0.0 - 1.0 mg/dL   Nitrite NEGATIVE  NEGATIVE  Leukocytes, UA NEGATIVE  NEGATIVE  POCT PREGNANCY, URINE      Component Value Range   Preg Test, Ur NEGATIVE  NEGATIVE   No results found.   No results found.   1. Vaginitis       MDM   Patient with dysuria for the last week and vaginal discharge. States she was last sexually active one month ago. Patient denies any abdominal pain or symptoms concerning for pyelonephritis. She has no symptoms of PID on exam. Wet prep shows many white blood cells and bacterial vaginosis. Given the multiple white blood cells could be from yeast versus STD. Discussed the  findings with the patient and she opted for IM Rocephin in his throat and treatment for yeast UA and UPT are within normal limits.     I personally performed the services described in this documentation, which was scribed in my presence.  The recorded information has been reviewed and considered.    Gwyneth Sprout, MD 10/07/11 1909  Gwyneth Sprout, MD 10/07/11 1911

## 2011-10-08 LAB — GC/CHLAMYDIA PROBE AMP, GENITAL
Chlamydia, DNA Probe: NEGATIVE
GC Probe Amp, Genital: NEGATIVE

## 2014-10-27 ENCOUNTER — Encounter: Payer: Medicaid Other | Admitting: Medical

## 2014-11-25 ENCOUNTER — Other Ambulatory Visit (HOSPITAL_COMMUNITY)
Admission: RE | Admit: 2014-11-25 | Discharge: 2014-11-25 | Disposition: A | Discharge status: Home / Self Care | Payer: Self-pay | Source: Ambulatory Visit | Attending: Obstetrics and Gynecology | Admitting: Obstetrics and Gynecology

## 2014-11-25 ENCOUNTER — Encounter: Payer: Self-pay | Admitting: Medical

## 2014-11-25 ENCOUNTER — Ambulatory Visit (INDEPENDENT_AMBULATORY_CARE_PROVIDER_SITE_OTHER): Payer: Self-pay | Admitting: Obstetrics and Gynecology

## 2014-11-25 VITALS — BP 114/69 | HR 62 | Temp 98.0°F | Ht 63.0 in | Wt 250.1 lb

## 2014-11-25 DIAGNOSIS — R87619 Unspecified abnormal cytological findings in specimens from cervix uteri: Secondary | ICD-10-CM | POA: Insufficient documentation

## 2014-11-25 DIAGNOSIS — IMO0002 Reserved for concepts with insufficient information to code with codable children: Secondary | ICD-10-CM | POA: Insufficient documentation

## 2014-11-25 DIAGNOSIS — Z3202 Encounter for pregnancy test, result negative: Secondary | ICD-10-CM

## 2014-11-25 DIAGNOSIS — R896 Abnormal cytological findings in specimens from other organs, systems and tissues: Secondary | ICD-10-CM

## 2014-11-25 LAB — POCT PREGNANCY, URINE: PREG TEST UR: NEGATIVE

## 2014-11-25 NOTE — Progress Notes (Signed)
GYNECOLOGY CLINIC COLPOSCOPY VISIT AND PROCEDURE NOTE  29 y.o. G2P1010 here for colposcopy for ASCUS/HRHPV pap smear on 09/2014. Prior cervical cytology and/or colposcopy findings: first abnormal pap ascus/hhpv positive 08/2010 with no f/u, ascus with hpv negative 10/2011, and now the most recent pap. Never had colposcopy or cervical procedures. The patient is an occasional cigarette smoker. The patient not immunosuppressed. The patient is not pregnant. The patient is not taking anticoagulants and denies allergy to iodine.  Patient given informed consent, signed copy in the chart, time out was performed. Urine pregnancy test performed and confirmed to be negative. Placed in lithotomy position.   Gross findings:  Vagina: normal ` Vulva: normal   Cervix: normal  Visualization after:  Acetic acid: transformation zone at cervical os  Green or blue filter: punctation at 6 o'clock with sharp acetowhite borders  Upper one-third of vagina examined, revealing: normal  Biopsies obtained: 6 o'clock (one biopsy)  ECC specimen obtained: yes  Colposcopy adequate? Yes  All specimens were labelled and sent to pathology.  Patient was given post procedure instructions. Will follow up pathology and manage accordingly.  Shonna ChockNoah Trystan Akhtar, MD  OB/GYN Fellow

## 2014-11-25 NOTE — Patient Instructions (Signed)

## 2014-11-29 ENCOUNTER — Telehealth: Payer: Self-pay | Admitting: *Deleted

## 2014-11-29 ENCOUNTER — Telehealth: Payer: Self-pay | Admitting: Obstetrics and Gynecology

## 2014-11-29 NOTE — Telephone Encounter (Signed)
Called patient and informed her of need for pap in 1 year and colpo results. Patient had no further questions.

## 2014-11-29 NOTE — Telephone Encounter (Signed)
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 Nursing to call patient to inform.

## 2014-12-02 ENCOUNTER — Telehealth: Payer: Self-pay | Admitting: *Deleted

## 2014-12-02 NOTE — Telephone Encounter (Signed)
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 Advised her to call in August , 2017 for October , 2017 appointment. She voices understanding.

## 2014-12-06 ENCOUNTER — Encounter: Payer: Self-pay | Admitting: *Deleted

## 2016-01-03 ENCOUNTER — Encounter (HOSPITAL_COMMUNITY): Payer: Self-pay | Admitting: *Deleted

## 2016-02-15 ENCOUNTER — Ambulatory Visit (HOSPITAL_COMMUNITY): Payer: Self-pay

## 2016-02-26 ENCOUNTER — Encounter: Payer: Self-pay | Admitting: Family Medicine

## 2019-03-22 ENCOUNTER — Ambulatory Visit: Payer: Self-pay | Attending: Internal Medicine

## 2019-03-22 DIAGNOSIS — Z20822 Contact with and (suspected) exposure to covid-19: Secondary | ICD-10-CM | POA: Insufficient documentation

## 2019-03-23 LAB — NOVEL CORONAVIRUS, NAA: SARS-CoV-2, NAA: NOT DETECTED

## 2020-02-17 ENCOUNTER — Ambulatory Visit (HOSPITAL_COMMUNITY): Payer: Self-pay

## 2020-02-19 ENCOUNTER — Ambulatory Visit: Payer: Self-pay | Attending: Internal Medicine

## 2020-02-19 DIAGNOSIS — Z23 Encounter for immunization: Secondary | ICD-10-CM

## 2021-02-19 ENCOUNTER — Emergency Department (HOSPITAL_COMMUNITY)
Admission: EM | Admit: 2021-02-19 | Discharge: 2021-02-19 | Discharge status: Against Medical Advice | Payer: Commercial Managed Care - HMO | Attending: Student | Admitting: Student

## 2021-02-19 ENCOUNTER — Encounter (HOSPITAL_COMMUNITY): Payer: Self-pay | Admitting: Emergency Medicine

## 2021-02-19 DIAGNOSIS — Z5321 Procedure and treatment not carried out due to patient leaving prior to being seen by health care provider: Secondary | ICD-10-CM | POA: Diagnosis not present

## 2021-02-19 DIAGNOSIS — N9489 Other specified conditions associated with female genital organs and menstrual cycle: Secondary | ICD-10-CM | POA: Insufficient documentation

## 2021-02-19 DIAGNOSIS — R002 Palpitations: Secondary | ICD-10-CM | POA: Insufficient documentation

## 2021-02-19 LAB — CBC WITH DIFFERENTIAL/PLATELET
Abs Immature Granulocytes: 0.03 10*3/uL (ref 0.00–0.07)
Basophils Absolute: 0 10*3/uL (ref 0.0–0.1)
Basophils Relative: 0 %
Eosinophils Absolute: 0 10*3/uL (ref 0.0–0.5)
Eosinophils Relative: 0 %
HCT: 36.8 % (ref 36.0–46.0)
Hemoglobin: 12.5 g/dL (ref 12.0–15.0)
Immature Granulocytes: 1 %
Lymphocytes Relative: 29 %
Lymphs Abs: 1.4 10*3/uL (ref 0.7–4.0)
MCH: 29.8 pg (ref 26.0–34.0)
MCHC: 34 g/dL (ref 30.0–36.0)
MCV: 87.6 fL (ref 80.0–100.0)
Monocytes Absolute: 0.5 10*3/uL (ref 0.1–1.0)
Monocytes Relative: 10 %
Neutro Abs: 2.9 10*3/uL (ref 1.7–7.7)
Neutrophils Relative %: 60 %
Platelets: 323 10*3/uL (ref 150–400)
RBC: 4.2 MIL/uL (ref 3.87–5.11)
RDW: 13.4 % (ref 11.5–15.5)
WBC: 4.8 10*3/uL (ref 4.0–10.5)
nRBC: 0 % (ref 0.0–0.2)

## 2021-02-19 LAB — BASIC METABOLIC PANEL
Anion gap: 9 (ref 5–15)
BUN: 9 mg/dL (ref 6–20)
CO2: 25 mmol/L (ref 22–32)
Calcium: 9.3 mg/dL (ref 8.9–10.3)
Chloride: 107 mmol/L (ref 98–111)
Creatinine, Ser: 0.95 mg/dL (ref 0.44–1.00)
GFR, Estimated: >60 mL/min (ref >60)
Glucose, Bld: 96 mg/dL (ref 70–99)
Potassium: 4.5 mmol/L (ref 3.5–5.1)
Sodium: 141 mmol/L (ref 135–145)

## 2021-02-19 LAB — I-STAT BETA HCG BLOOD, ED (MC, WL, AP ONLY): I-stat hCG, quantitative: <5 m[IU]/mL (ref <5)

## 2021-02-19 LAB — MAGNESIUM: Magnesium: 2 mg/dL (ref 1.7–2.4)

## 2021-02-19 LAB — TSH: TSH: 3.367 u[IU]/mL (ref 0.350–4.500)

## 2021-02-19 NOTE — ED Provider Triage Note (Signed)
Emergency Medicine Provider Triage Evaluation Note  Adelaida Reindel , a 36 y.o. female  was evaluated in triage.  Pt complains of palpitations.  She had these in the past, yesterday they were worse than normal.  She drinks 2 iced coffees before bed, the like her heart was pounding in the chest.  Denies any specific chest pain, does not feel short of breath.  Does not take any medicine other than oral birth control daily.   Review of Systems  Positive: Palpitation Negative: Syncope  Physical Exam  BP 140/84 (BP Location: Right Arm)    Pulse 76    Temp 99 F (37.2 C) (Oral)    Resp 15    SpO2 100%  Gen:   Awake, no distress   Resp:  Normal effort  MSK:   Moves extremities without difficulty  Other:  Regular rhythm, S1-S2  Medical Decision Making  Medically screening exam initiated at 11:53 AM.  Appropriate orders placed.  Valente David was informed that the remainder of the evaluation will be completed by another provider, this initial triage assessment does not replace that evaluation, and the importance of remaining in the ED until their evaluation is complete.  Labs, TSH, Shirlean Schlein, [place] 02/19/21 1153

## 2021-02-19 NOTE — ED Notes (Signed)
Patient states she is leaving. 

## 2021-02-19 NOTE — ED Triage Notes (Signed)
Patient here with complaint of palpitations that are chronic for her but she states got worse last night after drinking two iced coffees. Patient states the palpitations have improved this morning and is only slightly worse than normal today. Patient alert, oriented, ambulatory, and in no apparent distress at this time.

## 2021-04-02 NOTE — Progress Notes (Signed)
Date:  04/03/2021   ID:  Larene Pickett, DOB 1986-01-15, MRN [id number]  PCP:  Lin Landsman, MD  Cardiologist:  Rex Kras, DO, [facility]  (established care 04/03/2021)  REASON FOR CONSULT: Palpitations  REQUESTING PHYSICIAN:  Lin Landsman, [address],  [place] [id number]  Chief Complaint  Patient presents with   Palpitations   New Patient (Initial Visit)    Referred Lin Landsman, MD    HPI  Emily Garrison is a 36 y.o. African-American female who presents to the office with a chief complaint of " palpitations."  She has no significant past medical history.  She is referred to the office at the request of Lin Landsman, MD for evaluation of palpitations.  Patient presents today for evaluation of palpitations.  Symptoms have been going on for the last several years, no change in intensity or duration.  Frequency has improved over the last several years; however, the worst episode was in January 2023 for which she went to the hospital for.  The night prior to going to the ED patient states that she had 3 cups of iced coffee and shortly thereafter went to bed.  When she was lying in bed she will feel intense palpitations because she was concerned she did not fall asleep.  Since her symptoms continue she went to the ED the following day.  However after waiting 20 hours in the waiting room she left [place] and followed up with PCP.  She is now referred to cardiology for further evaluation and management.  The symptoms occur once a day, lasting for a few seconds, no near syncope or syncopal event.  She does not drink coffee as of January 2023, consumes 2 cups of tea on a daily basis, 1 cup of wine daily, no illicit drug/stimulant medications slight weight loss supplements.  No known thyroid disease or anemia.  No family history of premature coronary artery disease, sudden cardiac death, or cardiomyopathy.  FUNCTIONAL STATUS: No structured exercise program or  daily routine.   ALLERGIES: Allergies  Allergen Reactions   Latex Hives    MEDICATION LIST PRIOR TO VISIT: Current Meds  Medication Sig   Levonorgestrel-Ethinyl Estradiol (AMETHIA) 0.15-0.03 &0.01 MG tablet Take 1 tablet by mouth daily.     PAST MEDICAL HISTORY: Past Medical History:  Diagnosis Date   Breast cyst    Vaginal Pap smear, abnormal     PAST SURGICAL HISTORY: Past Surgical History:  Procedure Laterality Date   CESAREAN SECTION     removal of breast cyst      FAMILY HISTORY: The patient family history includes Dementia in her maternal grandmother; Diabetes in her father. No family history of premature coronary disease or sudden cardiac death.  SOCIAL HISTORY:  The patient  reports that she quit smoking about 6 months ago. Her smoking use included cigarettes. She has a 2.50 pack-year smoking history. She has never used smokeless tobacco. She reports current alcohol use. She reports that she does not use drugs.  REVIEW OF SYSTEMS: Review of Systems  Cardiovascular:  Positive for palpitations. Negative for chest pain, claudication, dyspnea on exertion, leg swelling, near-syncope, orthopnea, paroxysmal nocturnal dyspnea and syncope.  Respiratory:  Negative for shortness of breath.    PHYSICAL EXAM: Vitals with BMI 04/03/2021 02/19/2021 02/19/2021  Height 5' 3"  - -  Weight 315 lbs 10 oz - -  BMI 17.61 - -  Systolic 607 371 062  Diastolic 76 73 72  Pulse 77 61 62  CONSTITUTIONAL: Well-developed and well-nourished. No acute distress.  SKIN: Skin is warm and dry. No rash noted. No cyanosis. No pallor. No jaundice HEAD: Normocephalic and atraumatic.  EYES: No scleral icterus MOUTH/THROAT: Moist oral membranes.  NECK: No JVD present. No thyromegaly noted. No carotid bruits  LYMPHATIC: No visible cervical adenopathy.  CHEST Normal respiratory effort. No intercostal retractions  LUNGS: Clear to auscultation bilaterally.  No stridor. No wheezes. No rales.   CARDIOVASCULAR: Regular rate and rhythm, positive S1-S2, no murmurs rubs or gallops appreciated. ABDOMINAL: Obese, soft, nontender, nondistended, positive bowel sounds all 4 quadrants. No apparent ascites.  EXTREMITIES: No peripheral edema, warm to touch, +2 bilateral DP and PT pulses HEMATOLOGIC: No significant bruising NEUROLOGIC: Oriented to person, place, and time. Nonfocal. Normal muscle tone.  PSYCHIATRIC: Normal mood and affect. Normal behavior. Cooperative  CARDIAC DATABASE: EKG: 02/19/2021: NSR, 63 bpm, without underlying injury pattern.  04/03/2021: NSR, 77bpm, normal axis, without underlying injury pattern.   Echocardiogram: No results found for this or any previous visit from the past 1095 days.    Stress Testing: No results found for this or any previous visit from the past 1095 days.   Heart Catheterization: None  LABORATORY DATA: CBC Latest Ref Rng & Units 02/19/2021 04/19/2010 04/18/2010  WBC 4.0 - 10.5 K/uL 4.8 9.6 10.9(H)  Hemoglobin 12.0 - 15.0 g/dL 12.5 7.3(L) 7.5 DELTA CHECK NOTED REPEATED TO VERIFY(L)  Hematocrit 36.0 - 46.0 % 36.8 21.9(L) 22.6(L)  Platelets 150 - 400 K/uL 323 123(L) 109 REPEATED TO VERIFY PLATELET COUNT CONFIRMED BY SMEAR(L)    CMP Latest Ref Rng & Units 02/19/2021 01/01/2010 09/10/2008  Glucose 70 - 99 mg/dL 96 97 94  BUN 6 - 20 mg/dL 9 4(L) 4(L)  Creatinine 0.44 - 1.00 mg/dL 0.95 0.65 0.83  Sodium 135 - 145 mmol/L 141 138 137  Potassium 3.5 - 5.1 mmol/L 4.5 3.9 3.8  Chloride 98 - 111 mmol/L 107 107 108  CO2 22 - 32 mmol/L 25 24 25   Calcium 8.9 - 10.3 mg/dL 9.3 9.3 9.1  Total Protein 6.0 - 8.3 g/dL - 6.5 6.6  Total Bilirubin 0.3 - 1.2 mg/dL - 0.4 0.6  Alkaline Phos 39 - 117 U/L - 58 49  AST 0 - 37 U/L - 15 17  ALT 0 - 35 U/L - 12 10    Lipid Panel  No results found for: CHOL, TRIG, HDL, CHOLHDL, VLDL, LDLCALC, LDLDIRECT, LABVLDL  No components found for: NTPROBNP No results for input(s): PROBNP in the last 8760 hours. Recent Labs     02/19/21 1152  TSH 3.367    BMP Recent Labs    02/19/21 1152  NA 141  K 4.5  CL 107  CO2 25  GLUCOSE 96  BUN 9  CREATININE 0.95  CALCIUM 9.3  GFRNONAA >60    HEMOGLOBIN A1C No results found for: HGBA1C, MPG  External Labs:  Date Collected: 03/14/2021 , information obtained by referring physician Potassium: 4.3 Creatinine 0.92 mg/dL. eGFR: 84 mL/min per 1.73 m Hemoglobin: 12.7 g/dL and hematocrit: 38.8 % Lipid profile: Total cholesterol 164 , triglycerides 121 , HDL 53 , LDL 89 AST: 14 , ALT: 14 , alkaline phosphatase: 53  TSH: 2.66    IMPRESSION:    ICD-10-CM   1. Palpitations  R00.2 EKG 12-Lead    2. Former smoker  Z87.891     3. Class 3 severe obesity due to excess calories without serious comorbidity with body mass index (BMI) of 50.0 to 59.9 in adult (  [facility])  E66.01    Z68.43        RECOMMENDATIONS: Emily Garrison is a 36 y.o. African-American female who presents today for evaluation of palpitation.   Palpitations Chronic Frequency improving. Worst episode in January 2023 for which she went to the emergency room department. Hemoglobin within acceptable range. TSH within normal limits. No identifiable reversible cause. No near-syncope or syncopal events. Plan 14-day extended Holter monitor to evaluate for dysrhythmias. Monitor for now. She is educated on going to ED via EMS if she has a syncopal event - she verbalizes understanding.  Former smoker Educated importance of continued smoking cessation  Class 3 severe obesity due to excess calories without serious comorbidity with body mass index (BMI) of 50.0 to 59.9 in adult ([place]) Body mass index is 55.91 kg/m. I reviewed with the patient the importance of diet, regular physical activity/exercise, weight loss.   Patient is educated on increasing physical activity gradually as tolerated.  With the goal of moderate intensity exercise for 30 minutes a day 5 days a week.  As part of this initial  consultation reviewed outside records provided by PCP which included office note, labs these findings have been summarized and noted above for further reference.  Discussed disease management, ordering diagnostic testing, coordination of care and patient education provided as a part of today's encounter.  Also reviewed the hospitalization records from February 19, 2021 including EKG and labs.  FINAL MEDICATION LIST END OF ENCOUNTER: No orders of the defined types were placed in this encounter.   There are no discontinued medications.   Current Outpatient Medications:    Levonorgestrel-Ethinyl Estradiol (AMETHIA) 0.15-0.03 &0.01 MG tablet, Take 1 tablet by mouth daily., Disp: , Rfl:   Orders Placed This Encounter  Procedures   EKG 12-Lead    There are no Patient Instructions on file for this visit.   --Continue cardiac medications as reconciled in final medication list. --Return in about 6 weeks (around 05/15/2021), or if symptoms worsen or fail to improve, for Follow up, Palpitations. Or sooner if needed. --Continue follow-up with your primary care physician regarding the management of your other chronic comorbid conditions.  Patient's questions and concerns were addressed to her satisfaction. She voices understanding of the instructions provided during this encounter.   This note was created using a voice recognition software as a result there may be grammatical errors inadvertently enclosed that do not reflect the nature of this encounter. Every attempt is made to correct such errors.  Rex Kras, [place], [facility]  Pager: [phone] Office: [phone]

## 2021-04-03 ENCOUNTER — Encounter: Payer: Self-pay | Admitting: Cardiology

## 2021-04-03 ENCOUNTER — Other Ambulatory Visit: Payer: Self-pay

## 2021-04-03 ENCOUNTER — Ambulatory Visit: Payer: Commercial Managed Care - HMO | Admitting: Cardiology

## 2021-04-03 ENCOUNTER — Inpatient Hospital Stay: Payer: Commercial Managed Care - HMO

## 2021-04-03 VITALS — BP 124/76 | HR 77 | Temp 98.0°F | Resp 16 | Ht 63.0 in | Wt 315.6 lb

## 2021-04-03 DIAGNOSIS — Z87891 Personal history of nicotine dependence: Secondary | ICD-10-CM

## 2021-04-03 DIAGNOSIS — R002 Palpitations: Secondary | ICD-10-CM

## 2021-04-03 DIAGNOSIS — Z6841 Body Mass Index (BMI) 40.0 and over, adult: Secondary | ICD-10-CM

## 2021-04-09 ENCOUNTER — Telehealth: Payer: Self-pay

## 2021-04-09 NOTE — Telephone Encounter (Signed)
That is very understandable.  Please document the duration the monitor should have been on in the days she warranted for reference.

## 2021-05-06 ENCOUNTER — Other Ambulatory Visit: Payer: Self-pay

## 2021-05-06 ENCOUNTER — Encounter (HOSPITAL_BASED_OUTPATIENT_CLINIC_OR_DEPARTMENT_OTHER): Payer: Self-pay | Admitting: Emergency Medicine

## 2021-05-06 ENCOUNTER — Emergency Department (HOSPITAL_BASED_OUTPATIENT_CLINIC_OR_DEPARTMENT_OTHER)
Admission: EM | Admit: 2021-05-06 | Discharge: 2021-05-06 | Disposition: A | Discharge status: Home / Self Care | Payer: Commercial Managed Care - HMO | Attending: Emergency Medicine | Admitting: Emergency Medicine

## 2021-05-06 DIAGNOSIS — Z91040 Latex allergy status: Secondary | ICD-10-CM | POA: Diagnosis not present

## 2021-05-06 DIAGNOSIS — J039 Acute tonsillitis, unspecified: Secondary | ICD-10-CM | POA: Diagnosis not present

## 2021-05-06 DIAGNOSIS — J029 Acute pharyngitis, unspecified: Secondary | ICD-10-CM | POA: Diagnosis present

## 2021-05-06 LAB — GROUP A STREP BY PCR: Group A Strep by PCR: NOT DETECTED

## 2021-05-06 MED ORDER — AMOXICILLIN 500 MG PO CAPS
500.0000 mg | ORAL_CAPSULE | Freq: Once | ORAL | Status: AC
Start: 2021-05-06 — End: 2021-05-06
  Administered 2021-05-06: 500 mg via ORAL
  Filled 2021-05-06: qty 1

## 2021-05-06 MED ORDER — AMOXICILLIN 500 MG PO CAPS: 500.0000 mg | ORAL_CAPSULE | Freq: Three times a day (TID) | ORAL | 0 refills | Status: DC

## 2021-05-06 MED ORDER — DEXAMETHASONE 4 MG PO TABS
10.0000 mg | ORAL_TABLET | Freq: Once | ORAL | Status: AC
  Administered 2021-05-06: 10 mg via ORAL
  Filled 2021-05-06: qty 3

## 2021-05-06 MED ORDER — IBUPROFEN 200 MG PO TABS
600.0000 mg | ORAL_TABLET | Freq: Once | ORAL | Status: AC
  Administered 2021-05-06: 600 mg via ORAL
  Filled 2021-05-06: qty 1

## 2021-05-06 MED ORDER — DEXAMETHASONE 1 MG/ML PO CONC: 10.0000 mg | Freq: Once | ORAL | Status: DC

## 2021-05-06 MED ORDER — PREDNISONE 20 MG PO TABS: 20.0000 mg | ORAL_TABLET | Freq: Every day | ORAL | 0 refills | Status: AC

## 2021-05-06 NOTE — ED Notes (Signed)
Paperwork given to the Pt and understood it completely with no questions. ?

## 2021-05-06 NOTE — ED Triage Notes (Signed)
Pt via pov from home with sore throat x 2 days. Pt denies any other symptoms, including fever. Pt alert & oriented, nad noted.  ?

## 2021-05-06 NOTE — ED Notes (Signed)
ED Provider at bedside. 

## 2021-05-06 NOTE — ED Provider Notes (Signed)
?[facility] EMERG[facility] DEPT ?Provider Note ? ? ?CSN: [id number] ?Arrival date & time: 05/06/21  1806 ? ?  ? ?History ? ?Chief Complaint  ?Patient presents with  ? Sore Throat  ? ? ?Emily Garrison is a 36 y.o. female. ? ?Patient is a 36 year old female presenting for throat pain.  Patient states over the last 2 days she has had sore throat.  Denies any nausea, vomiting, fever, chills, coughing, nasal congestion, or rhinorrhea.  States she had a upper respiratory infection a few weeks ago.  No recent antibiotic use.  Pain with swallowing but no difficulty swallowing. ? ?The history is provided by the patient. No language interpreter was used.  ?Sore Throat ?Pertinent negatives include no chest pain, no abdominal pain and no shortness of breath.  ? ?  ? ?Home Medications ?Prior to Admission medications   ?Medication Sig Start Date End Date Taking? Authorizing Provider  ?Levonorgestrel-Ethinyl Estradiol (AMETHIA) 0.15-0.03 &0.01 MG tablet Take 1 tablet by mouth daily.    [provider]  ?   ? ?Allergies    ?Latex   ? ?Review of Systems   ?Review of Systems  ?Constitutional:  Negative for chills and fever.  ?HENT:  Positive for sore throat. Negative for ear pain.   ?Eyes:  Negative for pain and visual disturbance.  ?Respiratory:  Negative for cough and shortness of breath.   ?Cardiovascular:  Negative for chest pain and palpitations.  ?Gastrointestinal:  Negative for abdominal pain and vomiting.  ?Genitourinary:  Negative for dysuria and hematuria.  ?Musculoskeletal:  Negative for arthralgias and back pain.  ?Skin:  Negative for color change and rash.  ?Neurological:  Negative for seizures and syncope.  ?All other systems reviewed and are negative. ? ?Physical Exam ?Updated Vital Signs ?BP 135/82 (BP Location: Right Arm)   Pulse 83   Temp 99 ?F (37.2 ?C)   Resp 18   Ht 5\' 3"  (1.6 m)   Wt 127 kg   LMP 02/05/2021   SpO2 100%   BMI 49.60 kg/m?  ?Physical Exam ?Vitals and nursing note  reviewed.  ?Constitutional:   ?   General: She is not in acute distress. ?   Appearance: She is well-developed.  ?HENT:  ?   Head: Normocephalic and atraumatic.  ?   Mouth/Throat:  ?   Tonsils: No tonsillar abscesses. 3+ on the right. 3+ on the left.  ?   Comments: Enlarged tonsils with tonsillar exudates.  No tonsillar abscesses.  Cobblestoning of the oral pharynx.  No drooling or trismus. ?Eyes:  ?   Conjunctiva/sclera: Conjunctivae normal.  ?Cardiovascular:  ?   Rate and Rhythm: Normal rate and regular rhythm.  ?   Heart sounds: No murmur heard. ?Pulmonary:  ?   Effort: Pulmonary effort is normal. No respiratory distress.  ?   Breath sounds: Normal breath sounds.  ?Abdominal:  ?   Palpations: Abdomen is soft.  ?   Tenderness: There is no abdominal tenderness.  ?Musculoskeletal:     ?   General: No swelling.  ?   Cervical back: Neck supple.  ?Skin: ?   General: Skin is warm and dry.  ?   Capillary Refill: Capillary refill takes less than 2 seconds.  ?Neurological:  ?   Mental Status: She is alert.  ?Psychiatric:     ?   Mood and Affect: Mood normal.  ? ? ?ED Results / Procedures / Treatments   ?Labs ?(all labs ordered are listed, but only abnormal results are displayed) ?Labs  Reviewed  ?GROUP A STREP BY PCR  ? ? ?EKG ?None ? ?Radiology ?No results found. ? ?Procedures ?Procedures  ? ? ?Medications Ordered in ED ?Medications  ?ibuprofen (ADVIL) tablet 600 mg (has no administration in time range)  ?dexamethasone (DECADRON) 1 MG/ML solution 10 mg (has no administration in time range)  ?amoxicillin (AMOXIL) capsule 500 mg (has no administration in time range)  ? ? ?ED Course/ Medical Decision Making/ A&P ?  ?                        ?Medical Decision Making ?Risk ?Prescription drug management. ? ? ?36 year old female presenting for throat pain.  Patient is alert and oriented x3, no acute distress, afebrile, stable vital signs physical exam demonstrates no respiratory distress, drooling, or difficulty tolerating  secretions. Enlarged tonsils with tonsillar exudates.  No tonsillar abscesses.  Cobblestoning of the oral pharynx.  No drooling or trismus.  Decadron and amoxicillin given for tonsillitis.  No evidence of epiglottitis, prevertebral abscess, or Ludwig angina. ? ?Patient in no distress and overall condition improved here in the ED. Detailed discussions were had with the patient regarding current findings, and need for close f/u with PCP or on call doctor. The patient has been instructed to return immediately if the symptoms worsen in any way for re-evaluation. Patient verbalized understanding and is in agreement with current care plan. All questions answered prior to discharge. ? ? ? ? ? ? ? ? ?Final Clinical Impression(s) / ED Diagnoses ?Final diagnoses:  ?Tonsillitis  ? ? ?Rx / DC Orders ?ED Discharge Orders   ? ? None  ? ?  ? ? ?  ?Franne Forts, DO ?05/06/21 2141 ? ?

## 2021-05-12 ENCOUNTER — Emergency Department (HOSPITAL_BASED_OUTPATIENT_CLINIC_OR_DEPARTMENT_OTHER): Payer: Commercial Managed Care - HMO

## 2021-05-12 ENCOUNTER — Other Ambulatory Visit: Payer: Self-pay

## 2021-05-12 ENCOUNTER — Encounter (HOSPITAL_BASED_OUTPATIENT_CLINIC_OR_DEPARTMENT_OTHER): Payer: Self-pay | Admitting: *Deleted

## 2021-05-12 DIAGNOSIS — I493 Ventricular premature depolarization: Secondary | ICD-10-CM | POA: Diagnosis not present

## 2021-05-12 DIAGNOSIS — Z91040 Latex allergy status: Secondary | ICD-10-CM | POA: Diagnosis not present

## 2021-05-12 DIAGNOSIS — R002 Palpitations: Secondary | ICD-10-CM | POA: Diagnosis present

## 2021-05-12 DIAGNOSIS — R0789 Other chest pain: Secondary | ICD-10-CM | POA: Insufficient documentation

## 2021-05-12 LAB — BASIC METABOLIC PANEL
Anion gap: 12 (ref 5–15)
BUN: 19 mg/dL (ref 6–20)
CO2: 23 mmol/L (ref 22–32)
Calcium: 9.8 mg/dL (ref 8.9–10.3)
Chloride: 103 mmol/L (ref 98–111)
Creatinine, Ser: 0.88 mg/dL (ref 0.44–1.00)
GFR, Estimated: >60 mL/min (ref >60)
Glucose, Bld: 80 mg/dL (ref 70–99)
Potassium: 3.9 mmol/L (ref 3.5–5.1)
Sodium: 138 mmol/L (ref 135–145)

## 2021-05-12 LAB — TROPONIN I (HIGH SENSITIVITY): Troponin I (High Sensitivity): 2 ng/L (ref <18)

## 2021-05-12 LAB — CBC
HCT: 34.5 % — ABNORMAL LOW (ref 36.0–46.0)
Hemoglobin: 11.8 g/dL — ABNORMAL LOW (ref 12.0–15.0)
MCH: 29.5 pg (ref 26.0–34.0)
MCHC: 34.2 g/dL (ref 30.0–36.0)
MCV: 86.3 fL (ref 80.0–100.0)
Platelets: 293 10*3/uL (ref 150–400)
RBC: 4 MIL/uL (ref 3.87–5.11)
RDW: 14.1 % (ref 11.5–15.5)
WBC: 7.4 10*3/uL (ref 4.0–10.5)
nRBC: 0 % (ref 0.0–0.2)

## 2021-05-12 LAB — PREGNANCY, URINE: Preg Test, Ur: NEGATIVE

## 2021-05-12 NOTE — ED Notes (Signed)
Checked on pt in waiting area, pt continues to have the sensation that her heart is beating out of her chest. Assured pt that EKG and tests were wnl and that we will work on getting her to a room.  Radiology called to facilitate her getting her xray ?

## 2021-05-12 NOTE — ED Triage Notes (Signed)
Pt here for "heart beating out of chest"  she has hx of same and has seen cardiology but today today she is having the worst episode and states that "every other beat" feels abnormal.  Pt is having sob with this ?

## 2021-05-12 NOTE — ED Notes (Addendum)
Pt states that she is leaving to xray, noted by staff that pt sat back down in lobby, will keep pt in system in the event that she would like the offered exam room when available.  ?

## 2021-05-13 ENCOUNTER — Emergency Department (HOSPITAL_COMMUNITY): Payer: Commercial Managed Care - HMO

## 2021-05-13 ENCOUNTER — Emergency Department (HOSPITAL_BASED_OUTPATIENT_CLINIC_OR_DEPARTMENT_OTHER)
Admission: EM | Admit: 2021-05-13 | Discharge: 2021-05-13 | Disposition: A | Discharge status: Home / Self Care | Payer: Commercial Managed Care - HMO | Source: Home / Self Care | Attending: Emergency Medicine | Admitting: Emergency Medicine

## 2021-05-13 ENCOUNTER — Other Ambulatory Visit: Payer: Self-pay

## 2021-05-13 ENCOUNTER — Emergency Department (HOSPITAL_BASED_OUTPATIENT_CLINIC_OR_DEPARTMENT_OTHER)
Admission: EM | Admit: 2021-05-13 | Discharge: 2021-05-13 | Disposition: A | Discharge status: Home / Self Care | Payer: Commercial Managed Care - HMO | Attending: Emergency Medicine | Admitting: Emergency Medicine

## 2021-05-13 ENCOUNTER — Encounter (HOSPITAL_COMMUNITY): Payer: Self-pay

## 2021-05-13 DIAGNOSIS — Z91040 Latex allergy status: Secondary | ICD-10-CM | POA: Insufficient documentation

## 2021-05-13 DIAGNOSIS — I493 Ventricular premature depolarization: Secondary | ICD-10-CM

## 2021-05-13 DIAGNOSIS — R0789 Other chest pain: Secondary | ICD-10-CM | POA: Insufficient documentation

## 2021-05-13 HISTORY — DX: Ventricular premature depolarization: I49.3

## 2021-05-13 LAB — CBC
HCT: 38.6 % (ref 36.0–46.0)
Hemoglobin: 12.9 g/dL (ref 12.0–15.0)
MCH: 29.8 pg (ref 26.0–34.0)
MCHC: 33.4 g/dL (ref 30.0–36.0)
MCV: 89.1 fL (ref 80.0–100.0)
Platelets: 258 10*3/uL (ref 150–400)
RBC: 4.33 MIL/uL (ref 3.87–5.11)
RDW: 14.6 % (ref 11.5–15.5)
WBC: 10.5 10*3/uL (ref 4.0–10.5)
nRBC: 0 % (ref 0.0–0.2)

## 2021-05-13 LAB — BASIC METABOLIC PANEL
Anion gap: 5 (ref 5–15)
BUN: 14 mg/dL (ref 6–20)
CO2: 27 mmol/L (ref 22–32)
Calcium: 9 mg/dL (ref 8.9–10.3)
Chloride: 106 mmol/L (ref 98–111)
Creatinine, Ser: 1.02 mg/dL — ABNORMAL HIGH (ref 0.44–1.00)
GFR, Estimated: >60 mL/min (ref >60)
Glucose, Bld: 88 mg/dL (ref 70–99)
Potassium: 4.7 mmol/L (ref 3.5–5.1)
Sodium: 138 mmol/L (ref 135–145)

## 2021-05-13 LAB — TROPONIN I (HIGH SENSITIVITY)
Troponin I (High Sensitivity): <2 ng/L (ref <18)
Troponin I (High Sensitivity): 3 ng/L (ref <18)
Troponin I (High Sensitivity): 3 ng/L (ref <18)

## 2021-05-13 LAB — MAGNESIUM: Magnesium: 2.5 mg/dL — ABNORMAL HIGH (ref 1.7–2.4)

## 2021-05-13 MED ORDER — METOPROLOL TARTRATE 25 MG PO TABS: 25.0000 mg | ORAL_TABLET | Freq: Two times a day (BID) | ORAL | 2 refills | Status: DC | PRN

## 2021-05-13 NOTE — ED Provider Notes (Signed)
?[facility] ?Provider Note ? ? ?CSN: [id number] ?Arrival date & time: 05/12/21  2137 ? ?  ? ?History ? ?Chief Complaint  ?Patient presents with  ? Palpitations  ? ? ?Emily Garrison is a 36 y.o. female. ? ?Patient presents to the emergency department for evaluation of heart palpitations.  This has been an ongoing issue for some time, but worsened tonight.  Patient also had a similar episode in January of this year where she had increased episodes.  She generally has fluttering or brief episode of feeling irregularity of her heart.  This has been happening today, however, back to back throughout the day.  Patient reports that she did follow-up with cardiology in February and had a 1 week monitor that did not show any arrhythmia. ? ? ?  ? ?Home Medications ?Prior to Admission medications   ?Medication Sig Start Date End Date Taking? Authorizing Provider  ?metoprolol tartrate (LOPRESSOR) 25 MG tablet Take 1 tablet (25 mg total) by mouth 2 (two) times daily as needed (heart palpitations). 05/13/21  Yes Isahi Godwin, Canary Brim, MD  ?amoxicillin (AMOXIL) 500 MG capsule Take 1 capsule (500 mg total) by mouth 3 (three) times daily. 05/06/21   Franne Forts, DO  ?Levonorgestrel-Ethinyl Estradiol (AMETHIA) 0.15-0.03 &0.01 MG tablet Take 1 tablet by mouth daily.    [provider]  ?   ? ?Allergies    ?Latex   ? ?Review of Systems   ?Review of Systems  ?Cardiovascular:  Positive for palpitations.  ? ?Physical Exam ?Updated Vital Signs ?BP 133/61   Pulse 76   Temp 98.7 ?F (37.1 ?C) (Oral)   Resp 18   Wt 127 kg   SpO2 100%   BMI 49.60 kg/m?  ?Physical Exam ?Vitals and nursing note reviewed.  ?Constitutional:   ?   General: She is not in acute distress. ?   Appearance: She is well-developed.  ?HENT:  ?   Head: Normocephalic and atraumatic.  ?   Mouth/Throat:  ?   Mouth: Mucous membranes are moist.  ?Eyes:  ?   General: Vision grossly intact. Gaze aligned appropriately.  ?   Extraocular  Movements: Extraocular movements intact.  ?   Conjunctiva/sclera: Conjunctivae normal.  ?Cardiovascular:  ?   Rate and Rhythm: Normal rate and regular rhythm.  ?   Pulses: Normal pulses.  ?   Heart sounds: Normal heart sounds, S1 normal and S2 normal. No murmur heard. ?  No friction rub. No gallop.  ?Pulmonary:  ?   Effort: Pulmonary effort is normal. No respiratory distress.  ?   Breath sounds: Normal breath sounds.  ?Abdominal:  ?   General: Bowel sounds are normal.  ?   Palpations: Abdomen is soft.  ?   Tenderness: There is no abdominal tenderness. There is no guarding or rebound.  ?   Hernia: No hernia is present.  ?Musculoskeletal:     ?   General: No swelling.  ?   Cervical back: Full passive range of motion without pain, normal range of motion and neck supple. No spinous process tenderness or muscular tenderness. Normal range of motion.  ?   Right lower leg: No edema.  ?   Left lower leg: No edema.  ?Skin: ?   General: Skin is warm and dry.  ?   Capillary Refill: Capillary refill takes less than 2 seconds.  ?   Findings: No ecchymosis, erythema, rash or wound.  ?Neurological:  ?   General: No focal deficit present.  ?  Mental Status: She is alert and oriented to person, place, and time.  ?   GCS: GCS eye subscore is 4. GCS verbal subscore is 5. GCS motor subscore is 6.  ?   Cranial Nerves: Cranial nerves 2-12 are intact.  ?   Sensory: Sensation is intact.  ?   Motor: Motor function is intact.  ?   Coordination: Coordination is intact.  ?Psychiatric:     ?   Attention and Perception: Attention normal.     ?   Mood and Affect: Mood normal.     ?   Speech: Speech normal.     ?   Behavior: Behavior normal.  ? ? ?ED Results / Procedures / Treatments   ?Labs ?(all labs ordered are listed, but only abnormal results are displayed) ?Labs Reviewed  ?CBC - Abnormal; Notable for the following components:  ?    Result Value  ? Hemoglobin 11.8 (*)   ? HCT 34.5 (*)   ? All other components within normal limits  ?BASIC  METABOLIC PANEL  ?PREGNANCY, URINE  ?TROPONIN I (HIGH SENSITIVITY)  ?TROPONIN I (HIGH SENSITIVITY)  ? ? ?EKG ?EKG Interpretation ? ?Date/Time:  Saturday May 12 2021 21:48:12 EDT ?Ventricular Rate:  78 ?PR Interval:  146 ?QRS Duration: 76 ?QT Interval:  376 ?QTC Calculation: 428 ?R Axis:   80 ?Text Interpretation: Sinus rhythm with occasional Premature ventricular complexes Cannot rule out Anterior infarct , age undetermined Abnormal ECG No previous ECGs available Confirmed by Gilda Crease [phone]) on 05/13/2021 2:17:47 AM ? ?Radiology ?DG Chest Port 1 View ? ?Result Date: 05/12/2021 ?CLINICAL DATA:  Chest pain with palpitations EXAM: PORTABLE CHEST 1 VIEW COMPARISON:  None. FINDINGS: The heart size and mediastinal contours are within normal limits. Both lungs are clear. The visualized skeletal structures are unremarkable. IMPRESSION: No active disease. Electronically Signed   By: Darliss Cheney M.D.   On: 05/12/2021 23:55   ? ?Procedures ?Procedures  ? ? ?Medications Ordered in ED ?Medications - No data to display ? ?ED Course/ Medical Decision Making/ A&P ?  ?                        ?Medical Decision Making ?Amount and/or Complexity of Data Reviewed ?Labs: ordered. ? ? ?Presents to the emergency department for evaluation of chest discomfort with palpitations.  Differential diagnosis considered includes unstable angina, coronary syndrome, benign arrhythmia such as PVCs, as well as more significant arrhythmias. ? ?Patient's work-up has been reassuring.  EKG shows PVCs, no evidence of ischemia or infarct.  Troponins negative.  Patient was symptomatic when EKG was performed, when evaluated in the exam room, asymptomatic.  PVCs have resolved.  This is likely the cause of her symptoms.  We will offer her as needed metoprolol and have her follow-up with Dr. Odis Hollingshead, her cardiologist. ? ? ? ? ? ? ? ?Final Clinical Impression(s) / ED Diagnoses ?Final diagnoses:  ?PVC (premature ventricular contraction)  ? ? ?Rx / DC  Orders ?ED Discharge Orders   ? ?      Ordered  ?  metoprolol tartrate (LOPRESSOR) 25 MG tablet  2 times daily PRN       ? 05/13/21 0303  ? ?  ?  ? ?  ? ? ?  ?Gilda Crease, MD ?05/13/21 0304 ? ?

## 2021-05-13 NOTE — Subjective & Objective (Signed)
CC: palpitations ?HPI: ?36 year old African-American female with no significant medical history presents to the ER today with continued palpitations.  She states these episodes started in early January.  She states that she had a upper respiratory infection and was taking Sudafed, Afrin nasal sprays.  She states that when she is exercising, she does not feel her heartbeat.  Its only when she is at rest she notices extra beats.  She states that it sometimes is painful.  She has no radiation to her pain except its central in her chest.  She has not had any shortness of breath.  She has not had any syncope.  No family history of early sudden cardiac death.  No family history of coronary disease.  Over the last couple months, she has noticed it increasing in frequency.  She is cut out all caffeine of her diet.  She was drinking 2 cups of coffee a day which exacerbated her palpitations.  Now she is drinking herbal tea.  She had a Zio patch performed in February 2023.  She was only able to wear it for 6 days due to a rash she got from the adhesive.  The partial Zio trial showed no significant arrhythmias. ? ?Patient was seen again last night in the ER for palpitations.  She was prescribed Lopressor 25 mg twice daily as needed.  She is only taking this 1 time today.  She states that she continues to feel palpitations. ? ?Laboratory evaluation: ? ?Troponin negative x3 ?Sodium 138 potassium 4.7 BUN of 14 creatinine 1.0 ? ?Magnesium 2.5 ? ?EDP discussed the case with cardiology. ? ?[facility] hospitalist contacted for consultation. ?

## 2021-05-13 NOTE — ED Provider Notes (Addendum)
?[place] ?Provider Note ? ? ?CSN: [id number] ?Arrival date & time: 05/13/21  1614 ? ?  ? ?History ? ?Chief Complaint  ?Patient presents with  ? Chest Pain  ? Palpitations  ?   ?  ? ? ?Emily Garrison is a 36 y.o. female. ? ?Patient presents ER chief complaint of chest discomfort and palpitation sensation.  She is had these problems for over 6 months intermittently.  Initially very sparsely and now more frequently.  She seen her cardiologist several months ago had a Holter monitor test that was unremarkable.  She was presented to the ER early last night and evaluated with negative work-up and continued metoprolol at home and discharged home.  She returns again stating that the palpitations are more frequent and more intense.  Complaining of some shortness of breath and presents to the ER otherwise denies fevers or cough or vomiting or diarrhea. ? ? ?  ? ?Home Medications ?Prior to Admission medications   ?Medication Sig Start Date End Date Taking? Authorizing Provider  ?amoxicillin (AMOXIL) 500 MG capsule Take 1 capsule (500 mg total) by mouth 3 (three) times daily. [id number]   Lianne Cure, DO  ?Levonorgestrel-Ethinyl Estradiol (AMETHIA) 0.15-0.03 &0.01 MG tablet Take 1 tablet by mouth daily.    [provider]  ?metoprolol tartrate (LOPRESSOR) 25 MG tablet Take 1 tablet (25 mg total) by mouth 2 (two) times daily as needed (heart palpitations). 05/13/21   Orpah Greek, MD  ?   ? ?Allergies    ?Latex   ? ?Review of Systems   ?Review of Systems  ?Constitutional:  Negative for fever.  ?HENT:  Negative for ear pain.   ?Eyes:  Negative for pain.  ?Respiratory:  Negative for cough.   ?Cardiovascular:  Positive for palpitations. Negative for chest pain.  ?Gastrointestinal:  Negative for abdominal pain.  ?Genitourinary:  Negative for flank pain.  ?Musculoskeletal:  Negative for back pain.  ?Skin:  Negative for rash.  ?Neurological:  Negative for headaches.   ? ?Physical Exam ?Updated Vital Signs ?BP 115/65   Pulse 65   Temp 98.1 ?F (36.7 ?C) (Oral)   Resp 18   SpO2 99%  ?Physical Exam ?Constitutional:   ?   General: She is not in acute distress. ?   Appearance: Normal appearance.  ?HENT:  ?   Head: Normocephalic.  ?   Nose: Nose normal.  ?Eyes:  ?   Extraocular Movements: Extraocular movements intact.  ?Cardiovascular:  ?   Rate and Rhythm: Rhythm irregular.  ?Pulmonary:  ?   Effort: Pulmonary effort is normal.  ?Musculoskeletal:     ?   General: Normal range of motion.  ?   Cervical back: Normal range of motion.  ?Neurological:  ?   General: No focal deficit present.  ?   Mental Status: She is alert. Mental status is at baseline.  ? ? ?ED Results / Procedures / Treatments   ?Labs ?(all labs ordered are listed, but only abnormal results are displayed) ?Labs Reviewed  ?BASIC METABOLIC PANEL - Abnormal; Notable for the following components:  ?    Result Value  ? Creatinine, Ser 1.02 (*)   ? All other components within normal limits  ?MAGNESIUM - Abnormal; Notable for the following components:  ? Magnesium 2.5 (*)   ? All other components within normal limits  ?CBC  ?TROPONIN I (HIGH SENSITIVITY)  ?TROPONIN I (HIGH SENSITIVITY)  ? ? ?EKG ?None ? ?Radiology ?DG Chest 2 View ? ?  Result Date: 05/13/2021 ?CLINICAL DATA:  Chest pain EXAM: CHEST - 2 VIEW COMPARISON:  05/12/2021 FINDINGS: The heart size and mediastinal contours are within normal limits. Both lungs are clear. The visualized skeletal structures are unremarkable. IMPRESSION: No active cardiopulmonary disease. Electronically Signed   By: Fidela Salisbury M.D.   On: 05/13/2021 18:20  ? ?DG Chest Port 1 View ? ?Result Date: 05/12/2021 ?CLINICAL DATA:  Chest pain with palpitations EXAM: PORTABLE CHEST 1 VIEW COMPARISON:  None. FINDINGS: The heart size and mediastinal contours are within normal limits. Both lungs are clear. The visualized skeletal structures are unremarkable. IMPRESSION: No active disease. Electronically  Signed   By: Ronney Asters M.D.   On: 05/12/2021 23:55   ? ?Procedures ?Procedures  ? ? ?Medications Ordered in ED ?Medications - No data to display ? ?ED Course/ Medical Decision Making/ A&P ?  ?                        ?Medical Decision Making ?Amount and/or Complexity of Data Reviewed ?Labs: ordered. ?Radiology: ordered. ? ? ?Chart review shows recent visit for similar complaint of palpitations last night. ? ?Cardiac monitoring shows sinus rhythm.  Intermittent PVCs noted. ? ?Rhythm strip shows episodes where PVCs coalesced into runs of 10-15 beats of PVC's at a time. ? ?Labs are sent CBC chemistry normal troponin normal. ? ?Case discussed with on-call cardiologist Dr.Patwardhan, who recommends hospitalist to bring the patient in for observation and evaluation by cardiology in the morning. ? ?Addendum: Patient was actually seen by the hospitalist team as well.  However after some discussion she declines observation stay in the hospital.  Prefers to follow-up on an outpatient basis as an appointment in a few days which she was advised to keep.  Advised immediate return if she has pain difficulty breathing or any additional concerns, otherwise discharged home in stable condition. ? ? ? ? ? ? ? ?Final Clinical Impression(s) / ED Diagnoses ?Final diagnoses:  ?PVC's (premature ventricular contractions)  ? ? ?Rx / DC Orders ?ED Discharge Orders   ? ? None  ? ?  ? ? ?  ?Luna Fuse, MD ?05/13/21 2023 ? ?  ?Luna Fuse, MD ?05/13/21 2045 ? ?

## 2021-05-13 NOTE — Assessment & Plan Note (Signed)
Discussed with patient that PVC are not harmful. Pt is sensitive to extra beats. Pt has only taken 1 dose of lopressor. Discussed with her that she needs to take it regularly twice a day for at least 1 week before she had reached steady state in order to reap benefits from lopressor. Discussed staying well hydrated due to antihypertensive effects of lopressor. Recommended pt buy a automatic BP cuff(upper arm, not wrist) to monitor her BP. If she notices lower BP with lopressor to the point that she is dizzy/light headed, she should decrease lopressor to 12.5 mg bid.  Advised her to stay well hydrated. Can take potassium and magnesium supplements as well. 10-20 meq kcl and 400 mg mag oxide per day.  If mag oxide give her diarrhea, then stop. Pt with normal renal function.  Advised that she stay away from all energy drinks(red bull, monster, etc), avoid decongestants like sudafed, phenylephrine, avoid Afrin nasal spray and Visine eye drops. Pt states she is avoiding caffeine. Advised her not to drink tea as it also contains caffeine. Pt states she drinks herbal tea that is labeled caffeine-free. After our conversation, pt does not think she needs to be admitted to hospital. I agree.  Discussed with EDP.  ?

## 2021-05-13 NOTE — ED Triage Notes (Signed)
Pt c/o continued CP/SHOB/palpitations. Seen at [facility] for same last night, workup revealed PVCs, dc w PRN metoprolol. Advises constant palpitations/SHOB today, took metoprolol at 1pm, no relief. ?

## 2021-05-13 NOTE — Discharge Instructions (Signed)
Call your primary care doctor or specialist as discussed in the next 2-3 days.   Return immediately back to the ER if:  Your symptoms worsen within the next 12-24 hours. You develop new symptoms such as new fevers, persistent vomiting, new pain, shortness of breath, or new weakness or numbness, or if you have any other concerns.  

## 2021-05-13 NOTE — Consult Note (Signed)
?Hospitalist consultation ?History and Physical  ? ? ?Emily Garrison [id number] DOB: 1985-02-18 DOA: 05/13/2021 ? ?DOS: the patient was seen and examined on 05/13/2021 ? ?PCP: Lin Landsman, MD  ? ?Patient coming from: Home ? ?I have personally briefly reviewed patient's old medical records in [place] ? ?CC: palpitations ?HPI: ?36 year old African-American female with no significant medical history presents to the ER today with continued palpitations.  She states these episodes started in early January.  She states that she had a upper respiratory infection and was taking Sudafed, Afrin nasal sprays.  She states that when she is exercising, she does not feel her heartbeat.  Its only when she is at rest she notices extra beats.  She states that it sometimes is painful.  She has no radiation to her pain except its central in her chest.  She has not had any shortness of breath.  She has not had any syncope.  No family history of early sudden cardiac death.  No family history of coronary disease.  Over the last couple months, she has noticed it increasing in frequency.  She is cut out all caffeine of her diet.  She was drinking 2 cups of coffee a day which exacerbated her palpitations.  Now she is drinking herbal tea.  She had a Zio patch performed in February 2023.  She was only able to wear it for 6 days due to a rash she got from the adhesive.  The partial Zio trial showed no significant arrhythmias. ? ?Patient was seen again last night in the ER for palpitations.  She was prescribed Lopressor 25 mg twice daily as needed.  She is only taking this 1 time today.  She states that she continues to feel palpitations. ? ?Laboratory evaluation: ? ?Troponin negative x3 ?Sodium 138 potassium 4.7 BUN of 14 creatinine 1.0 ? ?Magnesium 2.5 ? ?EDP discussed the case with cardiology. ? ?[facility] hospitalist contacted for consultation.  ? ?ED Course: PVC on telemetry. EKG shows isolated PVC ? ?Review of Systems:  ?Review of  Systems  ?Constitutional: Negative.   ?HENT: Negative.    ?Eyes: Negative.   ?Respiratory: Negative.    ?Cardiovascular:  Positive for palpitations.  ?Gastrointestinal: Negative.   ?Genitourinary: Negative.   ?Musculoskeletal: Negative.   ?Skin: Negative.   ?Neurological: Negative.   ?Endo/Heme/Allergies: Negative.   ?Psychiatric/Behavioral: Negative.    ?All other systems reviewed and are negative. ? ?Past Medical History:  ?Diagnosis Date  ? Breast cyst   ? Vaginal Pap smear, abnormal   ? ? ?Past Surgical History:  ?Procedure Laterality Date  ? CESAREAN SECTION    ? removal of breast cyst    ? ? ? reports that she quit smoking about 8 months ago. Her smoking use included cigarettes. She has a 2.50 pack-year smoking history. She has never used smokeless tobacco. She reports current alcohol use. She reports that she does not use drugs. ? ?Allergies  ?Allergen Reactions  ? Latex Hives  ? ? ?Family History  ?Problem Relation Age of Onset  ? Diabetes Father   ? Dementia Maternal Grandmother   ? Stroke Paternal Grandmother   ? Stroke Paternal Aunt   ? ? ?Prior to Admission medications   ?Medication Sig Start Date End Date Taking? Authorizing Provider  ?amoxicillin (AMOXIL) 500 MG capsule Take 1 capsule (500 mg total) by mouth 3 (three) times daily. 123456   Lianne Cure, DO  ?Levonorgestrel-Ethinyl Estradiol (AMETHIA) 0.15-0.03 &0.01 MG tablet Take 1 tablet by mouth daily.  [provider]  ?metoprolol tartrate (LOPRESSOR) 25 MG tablet Take 1 tablet (25 mg total) by mouth 2 (two) times daily as needed (heart palpitations). 05/13/21   Orpah Greek, MD  ? ? ?Physical Exam: ?Vitals:  ? 05/13/21 1745 05/13/21 1800 05/13/21 1900 05/13/21 2000  ?BP: (!) 152/81 139/81 (!) 144/88 115/65  ?Pulse: 78 68 72 65  ?Resp: 19 15 17 18   ?Temp:      ?TempSrc:      ?SpO2: 100% 100% 99% 99%  ? ? ?Physical Exam ?Vitals and nursing note reviewed.  ?Constitutional:   ?   General: She is not in acute distress. ?    Appearance: Normal appearance. She is obese. She is not ill-appearing, toxic-appearing or diaphoretic.  ?HENT:  ?   Head: Normocephalic and atraumatic.  ?   Nose: Nose normal.  ?Cardiovascular:  ?   Rate and Rhythm: Normal rate and regular rhythm.  ?   Pulses: Normal pulses.  ?Pulmonary:  ?   Effort: Pulmonary effort is normal.  ?Abdominal:  ?   General: Bowel sounds are normal. There is no distension.  ?   Tenderness: There is no abdominal tenderness. There is no guarding.  ?Skin: ?   General: Skin is warm and dry.  ?   Capillary Refill: Capillary refill takes less than 2 seconds.  ?Neurological:  ?   General: No focal deficit present.  ?   Mental Status: She is alert and oriented to person, place, and time.  ?  ? ?Labs on Admission: I have personally reviewed following labs and imaging studies ? ?CBC: ?Recent Labs  ?Lab 05/12/21 ?2206 05/13/21 ?1625  ?WBC 7.4 10.5  ?HGB 11.8* 12.9  ?HCT 34.5* 38.6  ?MCV 86.3 89.1  ?PLT 293 258  ? ?Basic Metabolic Panel: ?Recent Labs  ?Lab 05/12/21 ?2206 05/13/21 ?1625 05/13/21 ?1642  ?NA 138 138  --   ?K 3.9 4.7  --   ?CL 103 106  --   ?CO2 23 27  --   ?GLUCOSE 80 88  --   ?BUN 19 14  --   ?CREATININE 0.88 1.02*  --   ?CALCIUM 9.8 9.0  --   ?MG  --   --  2.5*  ? ?GFR: ?Estimated Creatinine Clearance: 101.8 mL/min (A) (by C-G formula based on SCr of 1.02 mg/dL (H)). ?Liver Function Tests: ?No results for input(s): AST, ALT, ALKPHOS, BILITOT, PROT, ALBUMIN in the last 168 hours. ?No results for input(s): LIPASE, AMYLASE in the last 168 hours. ?No results for input(s): AMMONIA in the last 168 hours. ?Coagulation Profile: ?No results for input(s): INR, PROTIME in the last 168 hours. ?Cardiac Enzymes: ?Recent Labs  ?Lab 05/12/21 ?2206 05/13/21 ?0140 05/13/21 ?1625 05/13/21 ?1823  ?TROPONINIHS 2 2 3 3   ? ?BNP (last 3 results) ?No results for input(s): PROBNP in the last 8760 hours. ?HbA1C: ?No results for input(s): HGBA1C in the last 72 hours. ?CBG: ?No results for input(s): GLUCAP in  the last 168 hours. ?Lipid Profile: ?No results for input(s): CHOL, HDL, LDLCALC, TRIG, CHOLHDL, LDLDIRECT in the last 72 hours. ?Thyroid Function Tests: ?No results for input(s): TSH, T4TOTAL, FREET4, T3FREE, THYROIDAB in the last 72 hours. ?Anemia Panel: ?No results for input(s): VITAMINB12, FOLATE, FERRITIN, TIBC, IRON, RETICCTPCT in the last 72 hours. ?Urine analysis: ?   ?Component Value Date/Time  ? [place] YELLOW 10/07/2011 1710  ? APPEARANCEUR CLEAR 10/07/2011 1710  ? LABSPEC 1.014 10/07/2011 1710  ? PHURINE 6.0 10/07/2011 1710  ? GLUCOSEU NEGATIVE  10/07/2011 1710  ? [place] NEGATIVE 10/07/2011 1710  ? [place] NEGATIVE 10/07/2011 1710  ? [place] NEGATIVE 10/07/2011 1710  ? [place] NEGATIVE 10/07/2011 1710  ? UROBILINOGEN 0.2 10/07/2011 1710  ? NITRITE NEGATIVE 10/07/2011 1710  ? LEUKOCYTESUR NEGATIVE 10/07/2011 1710  ? ?Lab Results  ?Component Value Date/Time  ? TSH 3.367 02/19/2021 11:52 AM  ? ? ? ? ?Radiological Exams on Admission: I have personally reviewed images ?DG Chest 2 View ? ?Result Date: 05/13/2021 ?CLINICAL DATA:  Chest pain EXAM: CHEST - 2 VIEW COMPARISON:  05/12/2021 FINDINGS: The heart size and mediastinal contours are within normal limits. Both lungs are clear. The visualized skeletal structures are unremarkable. IMPRESSION: No active cardiopulmonary disease. Electronically Signed   By: Fidela Salisbury M.D.   On: 05/13/2021 18:20  ? ?DG Chest Port 1 View ? ?Result Date: 05/12/2021 ?CLINICAL DATA:  Chest pain with palpitations EXAM: PORTABLE CHEST 1 VIEW COMPARISON:  None. FINDINGS: The heart size and mediastinal contours are within normal limits. Both lungs are clear. The visualized skeletal structures are unremarkable. IMPRESSION: No active disease. Electronically Signed   By: Ronney Asters M.D.   On: 05/12/2021 23:55   ? ?EKG: I have personally reviewed EKG: EKG with PVC ? ? ?Assessment/Plan ?Active Problems: ?  PVC (premature ventricular contraction) ?  ? ?Assessment and Plan: ?PVC  (premature ventricular contraction) ?Discussed with patient that PVC are not harmful. Pt is sensitive to extra beats. Pt has only taken 1 dose of lopressor. Discussed with her that she needs to take it regula

## 2021-05-15 ENCOUNTER — Ambulatory Visit: Payer: Commercial Managed Care - HMO | Admitting: Cardiology

## 2021-05-15 ENCOUNTER — Encounter: Payer: Self-pay | Admitting: Cardiology

## 2021-05-15 VITALS — BP 113/69 | HR 67 | Temp 97.6°F | Resp 16 | Ht 63.0 in | Wt 316.0 lb

## 2021-05-15 DIAGNOSIS — Z87891 Personal history of nicotine dependence: Secondary | ICD-10-CM

## 2021-05-15 DIAGNOSIS — R002 Palpitations: Secondary | ICD-10-CM

## 2021-05-15 DIAGNOSIS — R0602 Shortness of breath: Secondary | ICD-10-CM

## 2021-05-15 NOTE — Progress Notes (Signed)
? ?Date:  05/15/2021  ? ?ID:  Emily Garrison, DOB Feb 14, 1985, MRN [id number] ? ?PCP:  Lin Landsman, MD  ?Cardiologist:  Rex Kras, DO, [facility]  (established care 04/03/2021) ? ?Date: 05/15/21 ?Last Office Visit: 04/03/2021 ? ?Chief Complaint  ?Patient presents with  ? Palpitations  ? Hospitalization Follow-up  ? ? ?HPI  ?Emily Garrison is a 36 y.o. African-American female whose past medical history includes PVCs. ? ?Initially referred to the practice for evaluation of palpitation.  The symptoms initiated in January 2023 and has been present intermittently.  Since establishing care patient did undergo an extended Holter monitor which she wore for approximately 6 days which was overall unremarkable.  The shared decision was to monitor her clinically. ? ?Since last office visit patient has had 2 episodes of palpitations for which she went to the ED for further evaluation and management.  After the first episode she was placed on metoprolol tartrate and was asked to follow-up outpatient.  However after taking 1 dose of Lopressor she continued to have similar symptoms and went to the ED again.  During her second episode she was noted to have 15 beat run of PVCs and therefore was recommended to be hospitalized for observation.  However, patient chose to be discharged home and to follow-up as outpatient.  She was uptitrated on Lopressor 25 mg p.o. twice daily.  Patient states that she has been taking it once a day and overall is now asymptomatic.  Ideally she would like to be off of pharmacological therapy given her young age if possible.  ? ?She denies any chest pain or heart failure symptoms. ? ?During these episodes of palpitations she does get shortness of breath. ? ?Her overall functional status remained stable.  She goes out for a walk for at least 20 minutes a day.  When she is exercising she is completely asymptomatic and does not appreciate palpitations/PVCs.  The episode more pronounced while she is at rest.   When the episodes happen over the weekend she was drinking alcohol but denies use of illicit drugs, herbal supplements, no excessive caffeine intake.  ? ?She denies near-syncope or syncopal events. ?  ?FUNCTIONAL STATUS: ?No structured exercise program or daily routine.  ? ?ALLERGIES: ?Allergies  ?Allergen Reactions  ? Latex Hives  ? ? ?MEDICATION LIST PRIOR TO VISIT: ?Current Meds  ?Medication Sig  ? Levonorgestrel-Ethinyl Estradiol (AMETHIA) 0.15-0.03 &0.01 MG tablet Take 1 tablet by mouth daily.  ? metoprolol tartrate (LOPRESSOR) 25 MG tablet Take 1 tablet (25 mg total) by mouth 2 (two) times daily as needed (heart palpitations).  ?  ? ?PAST MEDICAL HISTORY: ?Past Medical History:  ?Diagnosis Date  ? Breast cyst   ? Vaginal Pap smear, abnormal   ? ? ?PAST SURGICAL HISTORY: ?Past Surgical History:  ?Procedure Laterality Date  ? CESAREAN SECTION    ? removal of breast cyst    ? ? ?FAMILY HISTORY: ?The patient family history includes Dementia in her maternal grandmother; Diabetes in her father; Stroke in her paternal aunt and paternal grandmother. ?No family history of premature coronary disease or sudden cardiac death. ? ?SOCIAL HISTORY:  ?The patient  reports that she quit smoking about 8 months ago. Her smoking use included cigarettes. She has a 2.50 pack-year smoking history. She has never used smokeless tobacco. She reports current alcohol use. She reports that she does not use drugs. ? ?REVIEW OF SYSTEMS: ?Review of Systems  ?Cardiovascular:  Positive for palpitations. Negative for chest pain, claudication, dyspnea on exertion,  leg swelling, near-syncope, orthopnea, paroxysmal nocturnal dyspnea and syncope.  ?Respiratory:  Positive for shortness of breath.   ? ?PHYSICAL EXAM: ? ?  05/15/2021  ?  8:55 AM 05/13/2021  ?  8:00 PM 05/13/2021  ?  7:00 PM  ?Vitals with BMI  ?Height 5' 3"     ?Weight 316 lbs    ?BMI 55.99    ?Systolic 269 485 462  ?Diastolic 69 65 88  ?Pulse 67 65 72  ? ? ?CONSTITUTIONAL: Well-developed and  well-nourished. No acute distress.  ?SKIN: Skin is warm and dry. No rash noted. No cyanosis. No pallor. No jaundice ?HEAD: Normocephalic and atraumatic.  ?EYES: No scleral icterus ?MOUTH/THROAT: Moist oral membranes.  ?NECK: No JVD present. No thyromegaly noted. No carotid bruits  ?LYMPHATIC: No visible cervical adenopathy.  ?CHEST Normal respiratory effort. No intercostal retractions  ?LUNGS: Clear to auscultation bilaterally.  No stridor. No wheezes. No rales.  ?CARDIOVASCULAR: Regular rate and rhythm, positive S1-S2, no murmurs rubs or gallops appreciated. ?ABDOMINAL: Obese, soft, nontender, nondistended, positive bowel sounds all 4 quadrants. No apparent ascites.  ?EXTREMITIES: No peripheral edema, warm to touch, +2 bilateral DP and PT pulses ?HEMATOLOGIC: No significant bruising ?NEUROLOGIC: Oriented to person, place, and time. Nonfocal. Normal muscle tone.  ?PSYCHIATRIC: Normal mood and affect. Normal behavior. Cooperative ? ?CARDIAC DATABASE: ?EKG: ?02/19/2021: NSR, 63 bpm, without underlying injury pattern. ? ?04/03/2021: NSR, 77bpm, normal axis, without underlying injury pattern.  ? ?05/15/2021: NSR, 60 bpm, normal axis, without underlying ischemia injury pattern. ? ?Echocardiogram: ?No results found for this or any previous visit from the past 1095 days. ?  ? ?Stress Testing: ?No results found for this or any previous visit from the past 1095 days. ? ? ?Heart Catheterization: ?None ? ?LABORATORY DATA: ? ?  Latest Ref Rng & Units 05/13/2021  ?  4:25 PM 05/12/2021  ? 10:06 PM 02/19/2021  ? 11:52 AM  ?CBC  ?WBC 4.0 - 10.5 K/uL 10.5   7.4   4.8    ?Hemoglobin 12.0 - 15.0 g/dL 12.9   11.8   12.5    ?Hematocrit 36.0 - 46.0 % 38.6   34.5   36.8    ?Platelets 150 - 400 K/uL 258   293   323    ? ? ? ?  Latest Ref Rng & Units 05/13/2021  ?  4:25 PM 05/12/2021  ? 10:06 PM 02/19/2021  ? 11:52 AM  ?CMP  ?Glucose 70 - 99 mg/dL 88   80   96    ?BUN 6 - 20 mg/dL 14   19   9     ?Creatinine 0.44 - 1.00 mg/dL 1.02   0.88   0.95    ?Sodium  135 - 145 mmol/L 138   138   141    ?Potassium 3.5 - 5.1 mmol/L 4.7   3.9   4.5    ?Chloride 98 - 111 mmol/L 106   103   107    ?CO2 22 - 32 mmol/L 27   23   25     ?Calcium 8.9 - 10.3 mg/dL 9.0   9.8   9.3    ? ? ?Lipid Panel  ?No results found for: CHOL, TRIG, HDL, CHOLHDL, VLDL, LDLCALC, LDLDIRECT, LABVLDL ? ?No components found for: NTPROBNP ?No results for input(s): PROBNP in the last 8760 hours. ?Recent Labs  ?  02/19/21 ?1152  ?TSH 3.367  ? ? ?BMP ?Recent Labs  ?  02/19/21 ?1152 05/12/21 ?2206 05/13/21 ?1625  ?NA 141 138  138  ?K 4.5 3.9 4.7  ?CL 107 103 106  ?CO2 25 23 27   ?GLUCOSE 96 80 88  ?BUN 9 19 14   ?CREATININE 0.95 0.88 1.02*  ?CALCIUM 9.3 9.8 9.0  ?GFRNONAA >60 >60 >60  ? ? ?HEMOGLOBIN A1C ?No results found for: HGBA1C, MPG ? ?External Labs:  ?Date Collected: 03/14/2021 , information obtained by referring physician ?Potassium: 4.3 ?Creatinine 0.92 mg/dL. ?eGFR: 84 mL/min per 1.73 m? ?Hemoglobin: 12.7 g/dL and hematocrit: 38.8 % ?Lipid profile: Total cholesterol 164 , triglycerides 121 , HDL 53 , LDL 89 ?AST: 14 , ALT: 14 , alkaline phosphatase: 53  ?TSH: 2.66   ? ?IMPRESSION: ? ?  ICD-10-CM   ?1. Palpitations  R00.2 EKG 12-Lead  ?  ?2. Shortness of breath  R06.02 PCV ECHOCARDIOGRAM COMPLETE  ?  ?3. Former smoker  Z87.891   ?  ?4. Class 3 severe obesity due to excess calories without serious comorbidity with body mass index (BMI) of 50.0 to 59.9 in adult [facility])  E66.01   ? W78.71   ?  ?  ? ?RECOMMENDATIONS: ?Emily Garrison is a 36 y.o. African-American female who presents today for evaluation of palpitation.  ? ?Palpitations ?Chronic. ?She has had 2 ER visits since last office encounter for evaluation of palpitations.  She is noted to have PVCs on telemetry. ?Started on Lopressor 25 mg p.o. twice daily.  Since initiation of beta-blocker she has remained asymptomatic.  But ideally would like to avoid pharmacological therapy in the long run if possible. ?TSH within normal limits. ?Hemoglobin within  acceptable range. ?No overt electrolyte abnormalities. ?EKG today shows normal sinus rhythm without ectopy. ?Shared decision was to keep her on Lopressor 25 mg p.o. twice daily for 2 weeks, followed by [facility]

## 2021-05-23 ENCOUNTER — Other Ambulatory Visit: Payer: Self-pay

## 2021-05-23 ENCOUNTER — Telehealth: Payer: Self-pay

## 2021-05-23 ENCOUNTER — Ambulatory Visit: Payer: Commercial Managed Care - HMO

## 2021-05-23 DIAGNOSIS — R0602 Shortness of breath: Secondary | ICD-10-CM

## 2021-05-23 MED ORDER — METOPROLOL TARTRATE 25 MG PO TABS: 25.0000 mg | ORAL_TABLET | Freq: Two times a day (BID) | ORAL | 2 refills | Status: DC | PRN

## 2021-05-29 ENCOUNTER — Other Ambulatory Visit: Payer: Self-pay

## 2021-05-29 MED ORDER — METOPROLOL TARTRATE 25 MG PO TABS
25.0000 mg | ORAL_TABLET | Freq: Two times a day (BID) | ORAL | 2 refills | Status: DC | PRN
Start: 2021-05-29 — End: 2021-06-26

## 2021-05-29 NOTE — Telephone Encounter (Signed)
Yes ST

## 2021-05-29 NOTE — Telephone Encounter (Signed)
Refill sent.

## 2021-06-26 ENCOUNTER — Encounter: Payer: Self-pay | Admitting: Cardiology

## 2021-06-26 ENCOUNTER — Ambulatory Visit: Payer: Commercial Managed Care - HMO | Admitting: Cardiology

## 2021-06-26 VITALS — BP 130/81 | HR 60 | Temp 97.0°F | Resp 16 | Ht 63.0 in | Wt 317.0 lb

## 2021-06-26 DIAGNOSIS — I493 Ventricular premature depolarization: Secondary | ICD-10-CM

## 2021-06-26 DIAGNOSIS — Z87891 Personal history of nicotine dependence: Secondary | ICD-10-CM

## 2021-06-26 DIAGNOSIS — R0602 Shortness of breath: Secondary | ICD-10-CM

## 2021-06-26 DIAGNOSIS — R002 Palpitations: Secondary | ICD-10-CM

## 2021-06-26 NOTE — Progress Notes (Signed)
? ?Date:  06/26/2021  ? ?ID:  Emily Garrison, DOB [birth year], MRN [id number] ? ?PCP:  Lin Landsman, MD  ?Cardiologist:  Rex Kras, DO, [facility]  (established care 04/03/2021) ? ?Date: 06/26/21 ?Last Office Visit: 05/15/2021 ? ?Chief Complaint  ?Patient presents with  ? Follow-up  ?  6 week ?PVC and review results.   ? ? ?HPI  ?Emily Garrison is a 36 y.o. African-American female whose past medical history includes PVCs. ? ?Was initially referred to the practice for evaluation of palpitations.  Underwent an extended Holter monitor which was essentially unremarkable.  Since then she has been to the emergency room department twice for symptomatic palpitations and was noted to have PVCs on telemetry.  She was started on metoprolol tartrate which she was taking regularly and given her young age wanted to wean off of it if possible. ? ?At last office visit I recommended tapering off and to see if her symptoms were to resurface.  Since last office visit patient states that she has successfully weaned herself off of Lopressor and has been asymptomatic over the last 2 weeks. ? ?She denies feeling tired, fatigue, near-syncope or syncopal event.  Denies angina pectoris or heart failure symptoms. ?  ?FUNCTIONAL STATUS: ?No structured exercise program or daily routine.  ? ?ALLERGIES: ?Allergies  ?Allergen Reactions  ? Latex Hives  ? ? ?MEDICATION LIST PRIOR TO VISIT: ?Current Meds  ?Medication Sig  ? potassium chloride (MICRO-K) 10 MEQ CR capsule Take 10 mEq by mouth 2 (two) times daily.  ?  ? ?PAST MEDICAL HISTORY: ?Past Medical History:  ?Diagnosis Date  ? Breast cyst   ? Vaginal Pap smear, abnormal   ? ? ?PAST SURGICAL HISTORY: ?Past Surgical History:  ?Procedure Laterality Date  ? CESAREAN SECTION    ? removal of breast cyst    ? ? ?FAMILY HISTORY: ?The patient family history includes Dementia in her maternal grandmother; Diabetes in her father; Stroke in her paternal aunt and paternal grandmother. ?No family history of  premature coronary disease or sudden cardiac death. ? ?SOCIAL HISTORY:  ?The patient  reports that she quit smoking about 9 months ago. Her smoking use included cigarettes. She has a 2.50 pack-year smoking history. She has never used smokeless tobacco. She reports current alcohol use. She reports that she does not use drugs. ? ?REVIEW OF SYSTEMS: ?Review of Systems  ?Cardiovascular:  Negative for chest pain, claudication, dyspnea on exertion, leg swelling, near-syncope, orthopnea, palpitations, paroxysmal nocturnal dyspnea and syncope.  ?Respiratory:  Negative for shortness of breath.   ? ?PHYSICAL EXAM: ? ?  06/26/2021  ?  1:34 PM 05/15/2021  ?  8:55 AM 05/13/2021  ?  8:00 PM  ?Vitals with BMI  ?Height _0  _1    ?Weight 317 lbs 316 lbs   ?BMI 56.17 55.99   ?Systolic 453 646 803  ?Diastolic 81 69 65  ?Pulse 60 67 65  ? ? ?CONSTITUTIONAL: Well-developed and well-nourished. No acute distress.  ?SKIN: Skin is warm and dry. No rash noted. No cyanosis. No pallor. No jaundice ?HEAD: Normocephalic and atraumatic.  ?EYES: No scleral icterus ?MOUTH/THROAT: Moist oral membranes.  ?NECK: No JVD present. No thyromegaly noted. No carotid bruits  ?LYMPHATIC: No visible cervical adenopathy.  ?CHEST Normal respiratory effort. No intercostal retractions  ?LUNGS: Clear to auscultation bilaterally.  No stridor. No wheezes. No rales.  ?CARDIOVASCULAR: Regular rate and rhythm, positive S1-S2, no murmurs rubs or gallops appreciated. ?ABDOMINAL: Obese, soft, nontender, nondistended, positive bowel sounds all 4 quadrants.  No apparent ascites.  ?EXTREMITIES: No peripheral edema, warm to touch, +2 bilateral DP and PT pulses ?HEMATOLOGIC: No significant bruising ?NEUROLOGIC: Oriented to person, place, and time. Nonfocal. Normal muscle tone.  ?PSYCHIATRIC: Normal mood and affect. Normal behavior. Cooperative ? ?CARDIAC DATABASE: ?EKG: ?02/19/2021: NSR, 63 bpm, without underlying injury pattern. ? ?04/03/2021: NSR, 77bpm, normal axis, without  underlying injury pattern.  ? ?05/15/2021: NSR, 60 bpm, normal axis, without underlying ischemia injury pattern. ? ?Echocardiogram: ?05/23/2021: ?Normal LV systolic function with visual EF 60-65%. Left ventricle cavity is normal in size. Normal left ventricular wall thickness. Normal global wall motion. Normal diastolic filling pattern, normal LAP.  ?No significant valvular heart disease. ?No prior study for comparison. ? ?Stress Testing: ?No results found for this or any previous visit from the past 1095 days. ? ? ?Heart Catheterization: ?None ? ?LABORATORY DATA: ? ?  Latest Ref Rng & Units 05/13/2021  ?  4:25 PM 05/12/2021  ? 10:06 PM 02/19/2021  ? 11:52 AM  ?CBC  ?WBC 4.0 - 10.5 K/uL 10.5   7.4   4.8    ?Hemoglobin 12.0 - 15.0 g/dL 12.9   11.8   12.5    ?Hematocrit 36.0 - 46.0 % 38.6   34.5   36.8    ?Platelets 150 - 400 K/uL 258   293   323    ? ? ? ?  Latest Ref Rng & Units 05/13/2021  ?  4:25 PM 05/12/2021  ? 10:06 PM 02/19/2021  ? 11:52 AM  ?CMP  ?Glucose 70 - 99 mg/dL 88   80   96    ?BUN 6 - 20 mg/dL _0 ?Creatinine 0.44 - 1.00 mg/dL 1.02   0.88   0.95    ?Sodium 135 - 145 mmol/L 138   138   141    ?Potassium 3.5 - 5.1 mmol/L 4.7   3.9   4.5    ?Chloride 98 - 111 mmol/L 106   103   107    ?CO2 22 - 32 mmol/L _1 ?Calcium 8.9 - 10.3 mg/dL 9.0   9.8   9.3    ? ? ?Lipid Panel  ?No results found for: CHOL, TRIG, HDL, CHOLHDL, VLDL, LDLCALC, LDLDIRECT, LABVLDL ? ?No components found for: NTPROBNP ?No results for input(s): PROBNP in the last 8760 hours. ?Recent Labs  ?  02/19/21 ?1152  ?TSH 3.367  ? ? ?BMP ?Recent Labs  ?  02/19/21 ?1152 05/12/21 ?2206 05/13/21 ?1625  ?NA 141 138 138  ?K 4.5 3.9 4.7  ?CL 107 103 106  ?CO2 _2 ?GLUCOSE 96 80 88  ?BUN _3 ?CREATININE 0.95 0.88 1.02*  ?CALCIUM 9.3 9.8 9.0  ?GFRNONAA >60 >60 >60  ? ? ?HEMOGLOBIN A1C ?No results found for: HGBA1C, MPG ? ?External Labs:  ?Date Collected: 03/14/2021 , information obtained by referring physician ?Potassium:  4.3 ?Creatinine 0.92 mg/dL. ?eGFR: 84 mL/min per 1.73 m? ?Hemoglobin: 12.7 g/dL and hematocrit: 38.8 % ?Lipid profile: Total cholesterol 164 , triglycerides 121 , HDL 53 , LDL 89 ?AST: 14 , ALT: 14 , alkaline phosphatase: 53  ?TSH: 2.66   ? ?IMPRESSION: ? ?  ICD-10-CM   ?1. Palpitations  R00.2   ?  ?2. Shortness of breath  R06.02   ?  ?3. Former smoker  Z87.891   ?  ?4. Class 3 severe obesity due to excess calories  without serious comorbidity with body mass index (BMI) of 50.0 to 59.9 in adult [facility])  E66.01   ? K44.01   ?  ? ?  ? ?RECOMMENDATIONS: ?Emily Garrison is a 36 y.o. African-American female who presents today for evaluation of palpitation.  ? ?Palpitations /premature ventricular contractions ?Resolved since last office visit. ?Has weaned off of Lopressor since last office visit. ?She has been off of pharmacological therapy for at least 2 weeks and feels great with no recurrence of palpitations. ?Patient currently takes over-the-counter potassium supplements.  Follow-up potassium levels are within normal limits. ?Patient is instructed to have her labs checked if she were to increase her intake of potassium on a daily basis. ?Shared decision is to hold off on additional pharmacological therapy at this time. ?Echo results reviewed with her in great detail and noted above for further reference. ?Patient is highly motivated to increase her physical activity and focus on weight loss. ? ?Shortness of breath ?Resolved since last office visit. ?Echocardiogram results independently reviewed and noted above for further reference. ?No additional testing or pharmacological therapy warranted at this time. ? ?Former smoker ?Educated on importance of continued smoking cessation. ? ?Class 3 severe obesity due to excess calories without serious comorbidity with body mass index (BMI) of 50.0 to 59.9 in adult [facility]) ?Body mass index is 56.15 kg/m?. ?I reviewed with the patient the importance of diet, regular physical  activity/exercise, weight loss.   ?Patient is educated on increasing physical activity gradually as tolerated.  With the goal of moderate intensity exercise for 30 minutes a day 5 days a week. ? ?Overall doing well from a c

## 2022-10-24 NOTE — Telephone Encounter (Signed)
This encounter was created in error - please disregard.

## 2022-10-31 IMAGING — DX DG CHEST 1V PORT
1 series · 1 of 1 positions shown · non-contrast
Comparison: None.

CLINICAL DATA: Chest pain with palpitations

EXAM:
PORTABLE CHEST 1 VIEW

[chest ap]
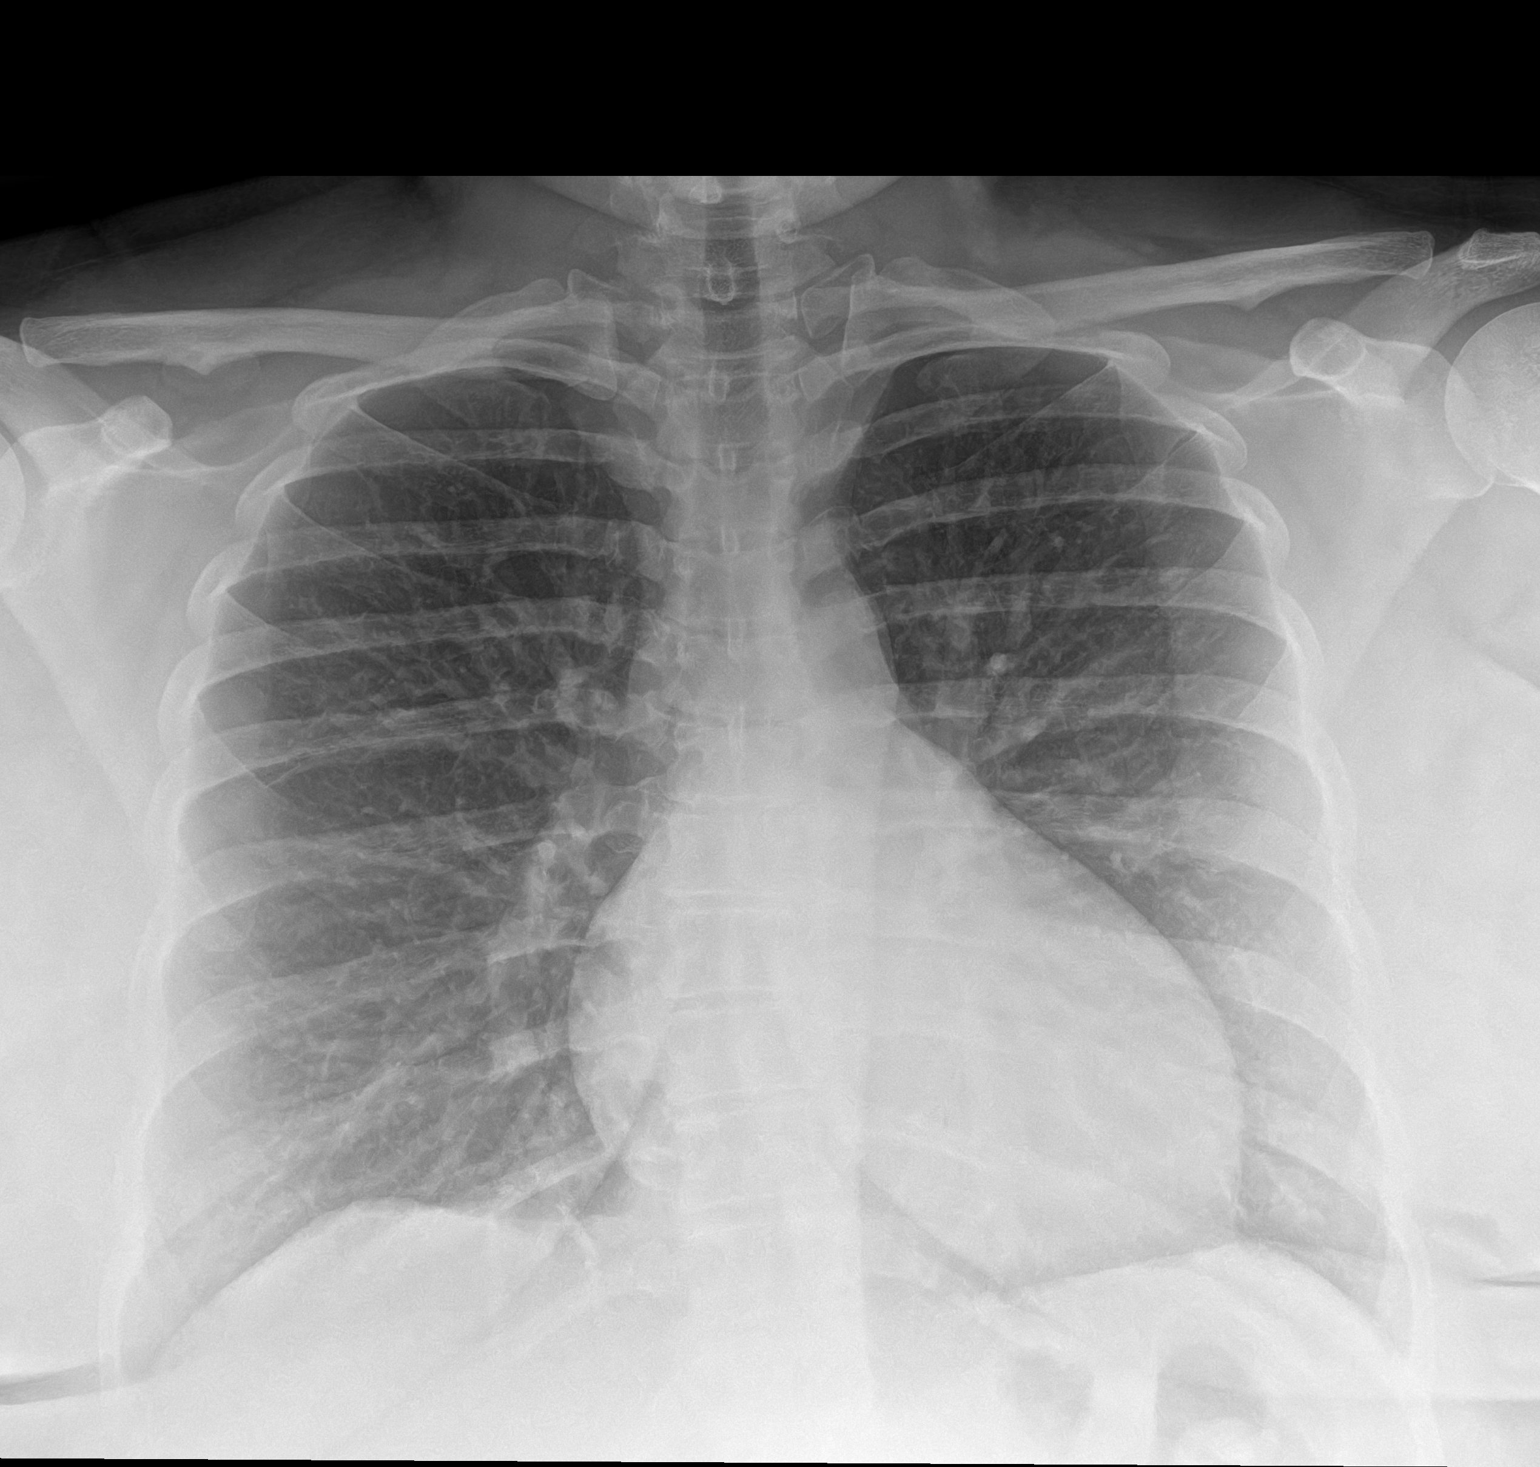

[1 of 1 positions shown; findings below may reference images not displayed]

FINDINGS: The heart size and mediastinal contours are within normal limits.
Both lungs are clear. The visualized skeletal structures are
unremarkable.
IMPRESSION: No active disease.

## 2022-11-01 IMAGING — CR DG CHEST 2V
2 series · 2 of 2 positions shown · non-contrast
Comparison: 05/12/2021

CLINICAL DATA: Chest pain

EXAM:
CHEST - 2 VIEW

[chest pa]
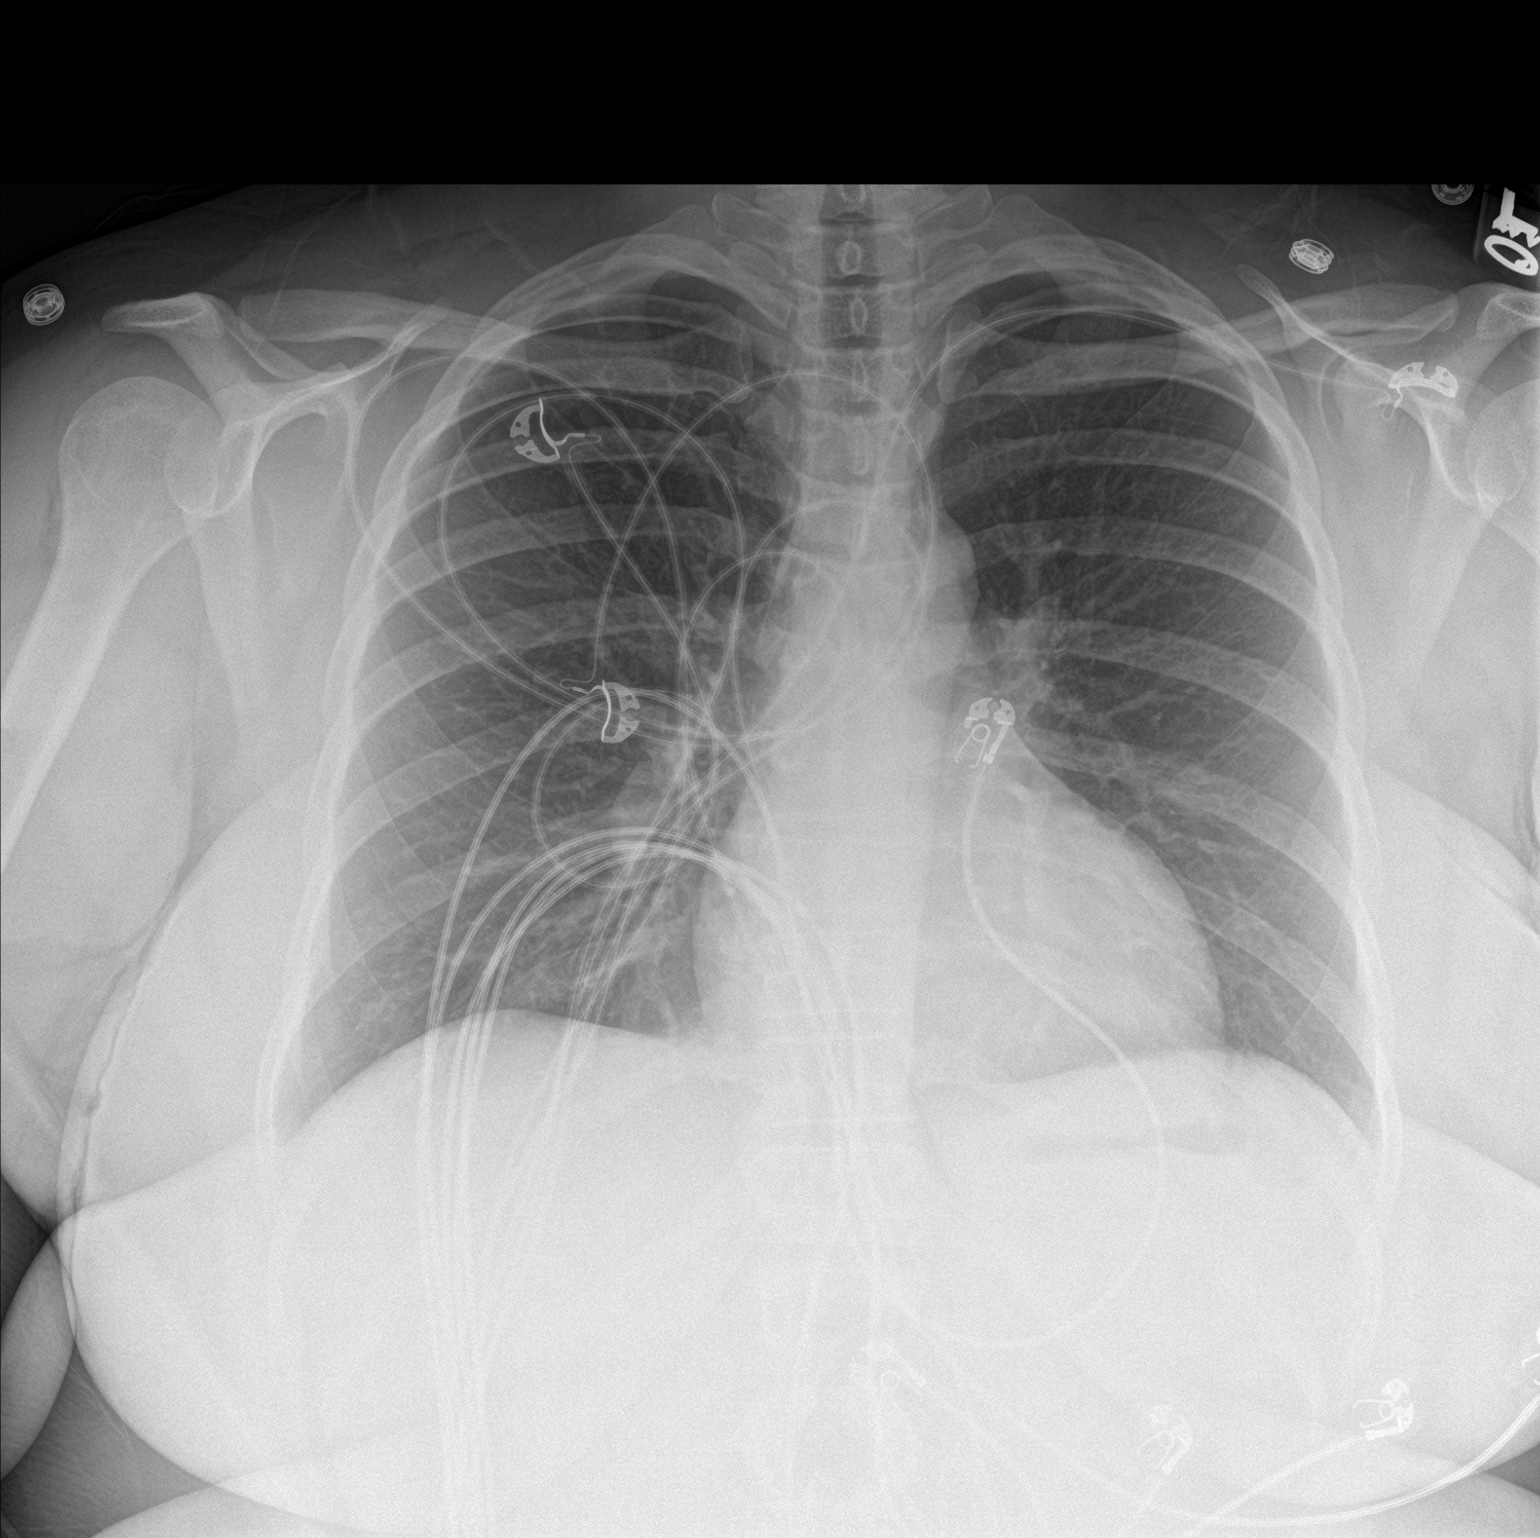

[chest lat]
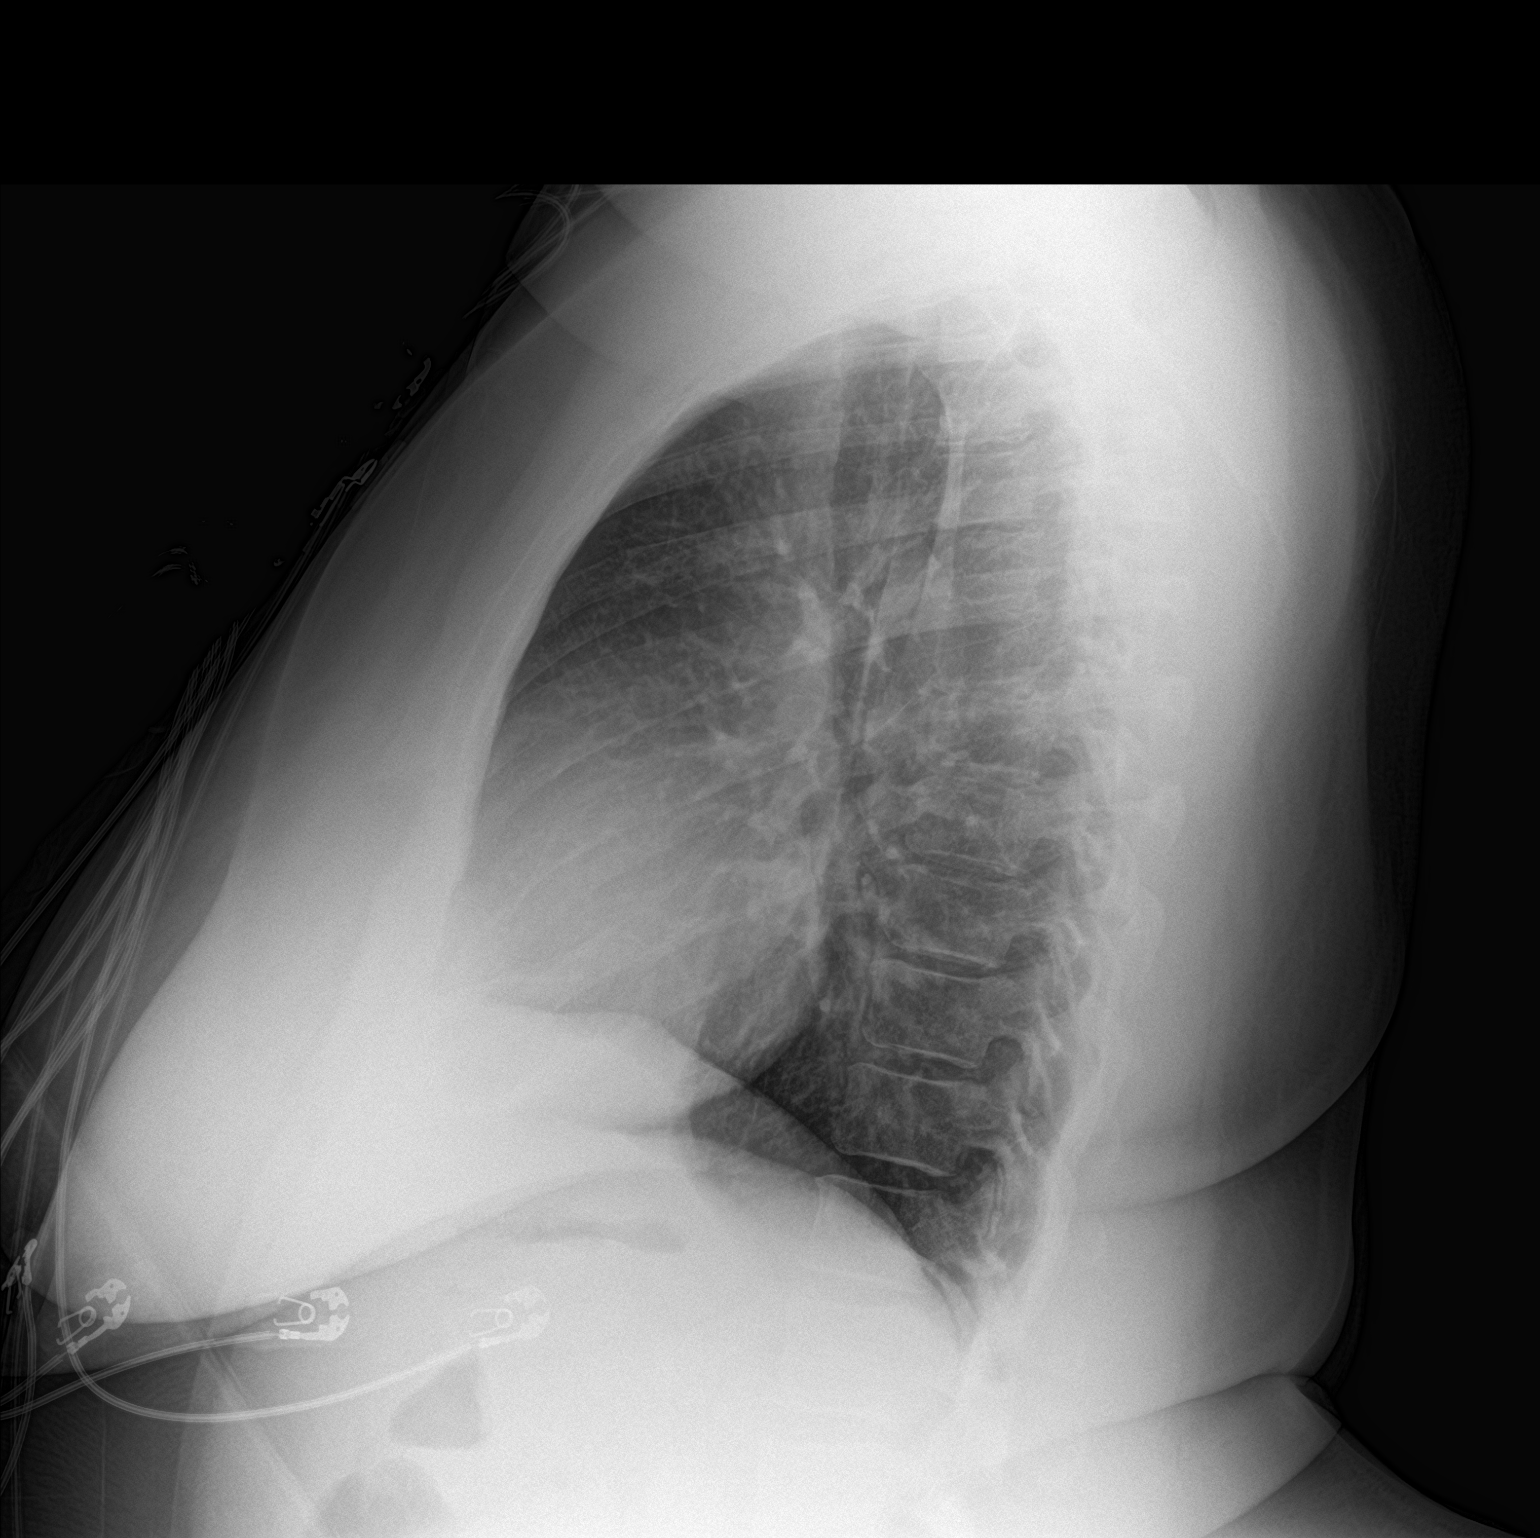

[2 of 2 positions shown; findings below may reference images not displayed]

FINDINGS: The heart size and mediastinal contours are within normal limits.
Both lungs are clear. The visualized skeletal structures are
unremarkable.
IMPRESSION: No active cardiopulmonary disease.

## 2023-03-12 ENCOUNTER — Telehealth: Payer: Commercial Managed Care - HMO

## 2023-03-12 DIAGNOSIS — Z3A09 9 weeks gestation of pregnancy: Secondary | ICD-10-CM | POA: Diagnosis not present

## 2023-03-12 DIAGNOSIS — Z3490 Encounter for supervision of normal pregnancy, unspecified, unspecified trimester: Secondary | ICD-10-CM | POA: Insufficient documentation

## 2023-03-12 DIAGNOSIS — O99019 Anemia complicating pregnancy, unspecified trimester: Secondary | ICD-10-CM | POA: Insufficient documentation

## 2023-03-12 DIAGNOSIS — Z3491 Encounter for supervision of normal pregnancy, unspecified, first trimester: Secondary | ICD-10-CM | POA: Diagnosis not present

## 2023-03-12 DIAGNOSIS — B009 Herpesviral infection, unspecified: Secondary | ICD-10-CM

## 2023-03-12 DIAGNOSIS — N6009 Solitary cyst of unspecified breast: Secondary | ICD-10-CM

## 2023-03-12 HISTORY — DX: Anemia complicating pregnancy, unspecified trimester: O99.019

## 2023-03-12 HISTORY — DX: Morbid (severe) obesity due to excess calories: E66.01

## 2023-03-12 HISTORY — DX: Herpesviral infection, unspecified: B00.9

## 2023-03-12 HISTORY — DX: Solitary cyst of unspecified breast: N60.09

## 2023-03-12 NOTE — Patient Instructions (Signed)
Safe Medications in Pregnancy   Acne:  Benzoyl Peroxide  Salicylic Acid   Backache/Headache:  Tylenol: 2 regular strength every 4 hours OR               2 Extra strength every 6 hours   Colds/Coughs/Allergies:  Benadryl (alcohol free) 25 mg every 6 hours as needed  Breath right strips  Claritin  Cepacol throat lozenges  Chloraseptic throat spray  Cold-Eeze- up to three times per day  Cough drops, alcohol free  Flonase (by prescription only)  Guaifenesin  Mucinex  Robitussin DM (plain only, alcohol free)  Saline nasal spray/drops  Sudafed (pseudoephedrine) & Actifed * use only after [redacted] weeks gestation and if you do not have high blood pressure  Tylenol  Vicks Vaporub  Zinc lozenges  Zyrtec   Constipation:  Colace  Ducolax suppositories  Fleet enema  Glycerin suppositories  Metamucil  Milk of magnesia  Miralax  Senokot  Smooth move tea   Diarrhea:  Kaopectate  Imodium A-D   *NO pepto Bismol   Hemorrhoids:  Anusol  Anusol HC  Preparation H  Tucks   Indigestion:  Tums  Maalox  Mylanta  Zantac  Pepcid   Insomnia:  Benadryl (alcohol free) 25mg  every 6 hours as needed  Tylenol PM  Unisom, no Gelcaps   Leg Cramps:  Tums  MagGel   Nausea/Vomiting:  Bonine  Dramamine  Emetrol  Ginger extract  Sea bands  Meclizine  Nausea medication to take during pregnancy:  Unisom (doxylamine succinate 25 mg tablets) Take one tablet daily at bedtime. If symptoms are not adequately controlled, the dose can be increased to a maximum recommended dose of two tablets daily (1/2 tablet in the morning, 1/2 tablet mid-afternoon and one at bedtime).  Vitamin B6 100mg  tablets. Take one tablet twice a day (up to 200 mg per day).   Skin Rashes:  Aveeno products  Benadryl cream or 25mg  every 6 hours as needed  Calamine Lotion  1% cortisone cream   Yeast infection:  Gyne-lotrimin 7  Monistat 7    **If taking multiple medications, please check labels to avoid  duplicating the same active ingredients  **take medication as directed on the label  ** Do not exceed 4000 mg of tylenol in 24 hours  **Do not take medications that contain aspirin or ibuprofen            Considering Waterbirth? Guide for patients at [facility]) Why consider waterbirth? Gentle birth for babies  Less pain medicine used in labor  May allow for passive descent/less pushing  May reduce perineal tears  More mobility and instinctive maternal position changes  Increased maternal relaxation   Is waterbirth safe? What are the risks of infection, drowning or other complications? Infection:  Very low risk (3.7 % for tub vs 4.8% for bed)  7 in 8000 waterbirths with documented infection  Poorly cleaned equipment most common cause  Slightly lower group B strep transmission rate  Drowning  Maternal:  Very low risk  Related to seizures or fainting  Newborn:  Very low risk. No evidence of increased risk of respiratory problems in multiple large studies  Physiological protection from breathing under water  Avoid underwater birth if there are any fetal complications  Once baby's head is out of the water, keep it out.  Birth complication  Some reports of cord trauma, but risk decreased by bringing baby to surface gradually  No evidence of increased risk of shoulder dystocia. Mothers  can usually change positions faster in water than in a bed, possibly aiding the maneuvers to free the shoulder.   There are 2 things you MUST do to have a waterbirth with [facility]: Attend a waterbirth class at [facility] at [facility]   3rd Wednesday of every month from 7-9 pm (virtual during COVID) [employer] at [email or web address] or [email or web address] or by calling [phone] Bring [place] the certificate from the class to your prenatal appointment or send via MyChart Meet with a midwife at 36 weeks* to see if you can still plan a  waterbirth and to sign the consent.   *We also recommend that you schedule as many of your prenatal visits with a midwife as possible.    Helpful information: You may want to bring a bathing suit top to the hospital to wear during labor but this is optional.  All other supplies are provided by the hospital. Please arrive at the hospital with signs of active labor, and do not wait at home until late in labor. It takes 45 min- 1 hour for fetal monitoring, and check in to your room to take place, plus transport and filling of the waterbirth tub.    Things that would prevent you from having a waterbirth: Premature, <37wks  Previous cesarean birth  Presence of thick meconium-stained fluid  Multiple gestation (Twins, triplets, etc.)  Uncontrolled diabetes or gestational diabetes requiring medication  Hypertension diagnosed in pregnancy or preexisting hypertension (gestational hypertension, preeclampsia, or chronic hypertension) Fetal growth restriction (your baby measures less than 10th percentile on ultrasound) Heavy vaginal bleeding  Non-reassuring fetal heart rate  Active infection (MRSA, etc.). Group B Strep is NOT a contraindication for waterbirth.  If your labor has to be induced and induction method requires continuous monitoring of the baby's heart rate  Other risks/issues identified by your obstetrical provider   Please remember that birth is unpredictable. Under certain unforeseeable circumstances your provider may advise against giving birth in the tub. These decisions will be made on a case-by-case basis and with the safety of you and your baby as our highest priority.    Updated 05/16/21

## 2023-03-12 NOTE — Progress Notes (Signed)
New OB Intake  I connected with Emily Garrison  on 03/12/23 at 11:15 AM EST by MyChart Video Visit and verified that I am speaking with the correct person using two identifiers. Nurse is located at [facility] and pt is located at home.  I discussed the limitations, risks, security and privacy concerns of performing an evaluation and management service by telephone and the availability of in person appointments. I also discussed with the patient that there may be a patient responsible charge related to this service. The patient expressed understanding and agreed to proceed.  I explained I am completing New OB Intake today. We discussed EDD of 10/10/2023, by Last Menstrual Period. Pt is [id number]. I reviewed her allergies, medications and Medical/Surgical/OB history.    Patient Active Problem List   Diagnosis Date Noted   Supervision of low-risk pregnancy 03/12/2023    Concerns addressed today  Delivery Plans Plans to deliver at [facility]. Discussed the nature of our practice with multiple providers including residents and students. Due to the size of the practice, the delivering provider may not be the same as those providing prenatal care.   Patient is not interested in water birth. Offered upcoming OB visit with CNM to discuss further.  MyChart/Babyscripts MyChart access verified. I explained pt will have some visits in office and some virtually. Babyscripts instructions given and order placed. Patient verifies receipt of registration text/e-mail. Account successfully created and app downloaded. If patient is a candidate for Optimized scheduling, add to sticky note.   Blood Pressure Cuff/Weight Scale Patient has private insurance; instructed to purchase blood pressure cuff and bring to first prenatal appt. Explained after first prenatal appt pt will check weekly and document in Babyscripts.  Anatomy US Schedule at new ob visit.  Is patient a CenteringPregnancy candidate?   Declined Declined due to Declined to say   Is patient a Mom+Baby Combined Care candidate?  Not a candidate   If accepted, confirm patient does not intend to move from the area for at least 12 months, then notify Mom+Baby staff  Interested in [place]? If yes, send referral and doula dot phrase.   Is patient a candidate for Babyscripts Optimization? yes   First visit review I reviewed new OB appt with patient. Explained pt will be seen by Emily Grates, FNP at first visit. Discussed Emily Garrison genetic screening with patient. [employer] and [employer].. Routine prenatal labs is needed at new ob visit.   Last Pap No results found for: "DIAGPAP"  [name], CMA 03/12/2023  12:10 PM

## 2023-03-17 ENCOUNTER — Ambulatory Visit (HOSPITAL_COMMUNITY)
Admission: RE | Admit: 2023-03-17 | Discharge: 2023-03-17 | Disposition: A | Discharge status: Home / Self Care | Payer: Medicaid Other | Source: Ambulatory Visit | Attending: Obstetrics and Gynecology | Admitting: Obstetrics and Gynecology

## 2023-03-17 ENCOUNTER — Telehealth: Payer: Self-pay

## 2023-03-17 ENCOUNTER — Encounter: Payer: Self-pay | Admitting: Obstetrics and Gynecology

## 2023-03-17 ENCOUNTER — Other Ambulatory Visit (HOSPITAL_COMMUNITY)
Admission: RE | Admit: 2023-03-17 | Discharge: 2023-03-17 | Disposition: A | Discharge status: Home / Self Care | Payer: Medicaid Other | Source: Ambulatory Visit | Attending: Obstetrics and Gynecology | Admitting: Obstetrics and Gynecology

## 2023-03-17 ENCOUNTER — Other Ambulatory Visit: Payer: Self-pay

## 2023-03-17 ENCOUNTER — Ambulatory Visit (INDEPENDENT_AMBULATORY_CARE_PROVIDER_SITE_OTHER): Payer: Commercial Managed Care - HMO | Admitting: Obstetrics and Gynecology

## 2023-03-17 VITALS — BP 137/82 | HR 76 | Wt 334.9 lb

## 2023-03-17 DIAGNOSIS — Z1332 Encounter for screening for maternal depression: Secondary | ICD-10-CM

## 2023-03-17 DIAGNOSIS — Z3490 Encounter for supervision of normal pregnancy, unspecified, unspecified trimester: Secondary | ICD-10-CM | POA: Insufficient documentation

## 2023-03-17 DIAGNOSIS — Z3A10 10 weeks gestation of pregnancy: Secondary | ICD-10-CM | POA: Diagnosis not present

## 2023-03-17 DIAGNOSIS — Z98891 History of uterine scar from previous surgery: Secondary | ICD-10-CM

## 2023-03-17 DIAGNOSIS — Z3491 Encounter for supervision of normal pregnancy, unspecified, first trimester: Secondary | ICD-10-CM | POA: Diagnosis not present

## 2023-03-17 DIAGNOSIS — O208 Other hemorrhage in early pregnancy: Secondary | ICD-10-CM | POA: Diagnosis present

## 2023-03-17 DIAGNOSIS — Z3A09 9 weeks gestation of pregnancy: Secondary | ICD-10-CM

## 2023-03-17 DIAGNOSIS — O021 Missed abortion: Secondary | ICD-10-CM | POA: Insufficient documentation

## 2023-03-17 MED ORDER — ASPIRIN 81 MG PO TBEC: 81.0000 mg | DELAYED_RELEASE_TABLET | Freq: Every day | ORAL | 2 refills | Status: DC

## 2023-03-17 NOTE — Telephone Encounter (Signed)
[facility] called RN line regarding stat US results.   RN called back [facility] [phone]. Informed of results of [place].   RN reported [place] results to [facility], NP who saw her this morning.

## 2023-03-17 NOTE — Patient Instructions (Signed)
 [facility] Pediatric Providers  [place] ([id number]) [facility] Manson Passey, MD; Deirdre Priest, MD; Lum Babe, MD; Leveda Anna, MD; McDiarmid, MD; Jerene Bears, MD [address] [place], [place] [address] [phone] Mon-Fri 8:30-12:30, 1:30-5:00  Providers come to see babies during newborn hospitalization Only accepting infants of Mother's who are seen at [facility] or have siblings seen at   [facility] Medicaid - Yes; Tricare - Yes   [facility], MD [address] [place], [place] [address] [phone] Mon, Tue, Thur, Fri 8:30-5:00, Wed 10:00-7:00 (closed 1-2pm daily for lunch) [facility] residents with no insurance.  [place] [employer] only with Medicaid/insurance; Tricare - no  [facility] for [facility]) - Tim [facility], MD; Manson Passey, MD; Ave Filter, MD; Luna Fuse, MD; Kennedy Bucker, MD; Florestine Avers, MD; Melchor Amour, MD; Yetta Barre,  MD; Konrad Dolores, MD; Kathlene November, MD; Jenne Campus, MD; Wynetta Emery, MD; Duffy Rhody, MD; Gerre Couch, NP [address] [place], [place], [place] [address] [phone] Mon, Tue, Thur, Fri 8:30-5:30, Wed 9:30-5:30, Sat 8:30-12:30 Only accepting infants of first-time parents or siblings of current patients Hospital discharge coordinator will make follow-up appointment Medicaid - yes; Tricare - yes  [place] [phone]) [facility] Ilean China, MD; Earlene Pl[place], MD; Jamesetta Orleans, MD; Alvera Novel, MD; Rana Snare, MD; [facility], MD; Shaaron Adler, MD; Hosie Poisson, MD; Mayford Knife, MD [address] [place], [place] [address] [phone] Mon-Fri 8:30-5:00, closed for lunch 12:30-1:30; Sat-Sun 10:00-1:00 Accepting Newborns with commercial insurance only, must call prior to delivery to be accepted into  practice.  Medicaid - no, Tricare - yes   [facility] [address] [place], [place] [address] [phone] or [phone] Mon to Fri 8am to 10pm, Sat 8am to 1pm  (virtual only on weekends) Only accepts Medicaid Healthy Blue pts  [facility] ([facility]) - Pediatrics at Elige Radon, MD; Sabino Dick, MD; Quitman Livings, MD; Betha Loa, NP; Claretha Cooper, MD; Lelon Perla, MD [address] [place], [place] [address] [phone] Mon-Fri 8:30-5:30 Medicaid - yes, Tricare - yes  [place] [phone]) [facility] Marcie Mowers, MD [address], [place], [place] [address] [phone] Iona Hansen, Wed Fri 8:30-5:00, Sat 8:30-12:00, Closed Thursdays Accepting siblings of established patients and first time mom's if you call prenatally Medicaid- yes; Tricare - yes  [facility] Lutricia Feil, [place]; Tracie Harrier, MD; Rusty Aus; Scifres, PA; Wynelle Link, MD; Azucena Cecil, MD;  [address] [place], [place] [address] [phone] Mon-Fri 8:30-5:00, closed for lunch 1-2 Only accepting newborns of established patients Medicaid- no; Tricare - yes  [facility] Jasper Ruminski [place] [phone]) [facility] Morene [place], MD; [address], [place], [place] [address] [phone] Mon-Fri 8:00-5:00 Medicaid - No; Tricare - Yes  [facility], [place]; [place], [place] [address], [place], [place] [address] [phone] Mon-Fri 8:00-5:00 Medicaid - No, Tricare - Yes  [facility] Cardell Peach, MD; Nash Dimmer, MD; [place], [place] [address] [place], [place] [address] [phone]  Mon-Fri 8:00-5:00 Medicaid - No; Tricare - Yes  [facility] [address] [place], [place] [address] [phone] Mon-Fri 8:30-5:00 (lunch 12:00-1:00) Medicaid -Yes; Tricare - Yes  [facility], MD [address] [place], [place], [place] [address] [phone] Mon-Fri 8:00-5:00 Seeing newborns of current patients only. No new patients Medicaid - No, Tricare - yes  Nature conservation officer at [place] [address], MD [address] [place], [place] [address] [phone] Mon-Fri 8:00-5:00 Medicaid -yes as secondary coverage only;  Tricare - yes  [facility] [place], [place]; [place], [place]; Avis Epley, MD; Vonna Kotyk, MD; Clance Boll, MD; [place], [place]; Smoot, NP; Vaughan Basta, MD; [place], MD [address] [place], [place] [address] [phone] Mon-Fri 8:30-5:00, Sat 9:00-11:00 Accepts commercial insurance ONLY. Offers free prenatal information sessions for families. Medicaid - No, Tricare - Call first  [facility], MD; [place], [place]; [place], [place]; [place], [place] [address] [place] [address] [phone] Mon-Fri 7:30-5:30 Medicaid - Yes; Ailene Rud yes  [place] [phone] & [phone])  [facility], MD [address], [place], [place] [address] [phone] Mon-Thur 8:00-6:00, closed for lunch 12-2, closed Fridays Medicaid - yes; Tricare - no  [facility] Dareen Piano, NP; Cyndia Bent, MD; [place], [place]; [place], [place] [address], [place], [place] [address] [phone] Mon-Fri 7:30-4:30 Medicaid - yes, Tricare - yes  [facility]  Juanito Doom, MD; Janene Harvey, NP; Vonita Moss, MD; Donn Pierini, NP [address], [place], [place] [address] [phone] Mon-Fri 8:30-5:00, closed for lunch 1-2, Sat 8:30-12:00 - sick visits only Providers come to see babies at [facility] Only accepting newborns of siblings and first time parents ONLY if who have met with office prior to delivery Medicaid -Yes; Tricare - yes  [facility] - [place], [place]; Spero Geralds, NP; Earlene [place], MD; Lucretia Roers, MD:  [address], [place], [place] [address] [phone] Mon- Fri 8:00-5:00, Sat 9:00-12:00 - sick visits only Accepting siblings of established patients and first time mom/baby Medicaid - Yes; Tricare - yes Patients must have vaccinations (baby vaccines)  [place] [phone] &  [phone])  Adult nurse HealthCare at [facility] [address] [place], [place] [address] [phone] Mon-Fri 8:00-5:00 Medicaid - no; Tricare - yes  [facility] Hamel, MD; [place], [place]; [place], [place] [address], [place], [place] [address] [phone] Mon-Fri 8:00-5:00 Medicaid- yes; Tricare - yes  [facility] - Ardeen Jourdain, MD; Yetta Barre, NP; [place], [place] [address] [place], [place], [place] [address] [phone] Mon-Fri 8:00-5:00 Medicaid - Yes; Tricare - yes  [address] [place] [phone])  [facility] [place], [place]; [place], [place]; Eddie Candle, MD; Normand Sloop, MD; [place], NP; Isenhour, DO; [place], [place]; Constance Goltz, MD; Ruthann Cancer, MD; Vear Clock, MD; [place], [place]; [place], [place]; [place], [place] [facility] [address], [place], [place] [address] [phone] Mon-Fri 8:30-5:00, Sat 9:00-12:00 - sick only Please register online [email or web address] then schedule online or call office Medicaid-Yes; Tricare -yes  [facility] - Premier  Dabrusco, MD; Romualdo Bolk, MD; Keener, MD; [place], NP; [place], [place]; Antonietta Barcelona, MD; Mayford Knife, NP; Shelva Majestic, MD [address], [place], [place] [address] [phone] Mon-Fri 8:00-5:30, Sat&Sun by appointment (phones open at 8:30) Medicaid - Yes; Tricare - yes  [place] [phone] & [phone]) [facility] Mariel Aloe; Chapin, MD; Roger Shelter, MD; Arvilla Market, NP; Obetz, DO [address], [place], [place] [address] [phone] M-F 8:00 - 5:15, Sat/Sun 9-12 sick visits only Medicaid - No; Tricare - yes  [facility]  Was[place], PA-C; Opal, PA-C; [place]on, DO; Morris, PA-C; [place]sted, PA-C; Roselyn Bering, MD [address], [place], [place] [address] [phone] Mon-Thur 8:00-7:00, Fri 8:00-5:00 Accepting Medicaid for 13 and under only   [facility] Family Medicine  at [place] [facility] [place]) [place], [place]; List, FNP; Berneda Rose, MD; [place], PA-C; Scholer,  MD; Kellie Simmering, FNP; Genevie Cheshire, FNP; Evaristo Bury, MD; Berneda Rose, MD [phone] [address], [place], [place] [address] [phone] Mon-Fri 8:30-5:30 Medicaid - Yes; Tricare - yes  [facility] - [address]  [place], [place]; Whitney Post, MD; Hennie Duos, MD; Wynne Dust, MD; [place], NP [address], [place], [place] [address] [phone] Mon-Thur 8:00-5:30, Fri 8:00-5:00, Sat 9:00-12:00 Medicaid - yes, Tricare - yes  [place] [phone])  [facility], [place]; Lenise Arena, MD; [place], [place] [address], [place], [place] [address] [phone] Mon-Fri 8:00-5:00, closed for lunch 12-1 Medicaid - No; Tricare - yes  Nature conservation officer at [facility], MD [address] [place], [place] [address] [phone] Mon-Fri 8:00-5:00 Medicaid - No; Tricare - yes  [facility], MD; Tami Ribas, MD; Mariam Dollar, MD; Yetta Barre, MD [address] [facility] [address], [place], [place] [address] [phone] Mon-Fri 8:00-5:00 Medicaid- Yes; Tricare - yes  [place] [phone])  Adult nurse HealthCare at [facility] [place], [place]; [place], MD [address] [place], [place], [place] [address] [phone] Mon-Fri 8:00-5:00 Medicaid - No; Tricare - yes  [facility] Medicine - Whitney Post - CPNP [address] [place], [place], [place] [address] [phone] Mon-Weds 8:00-6:00, Thurs-Fri 8:00-5:00, Sat 9:00-12:00 Medicaid - yes; Tricare - yes   [facility] Katharina Caper, MD; [place], [place] [address] [place], [place] [address] [phone] Mon-Fri 8:00-5:00 Medicaid - yes; Tricare - yes  [facility] Pediatric Providers  [facility] [address], [place], [place] [address] [phone] Sheral Flow: 8am -8pm, Tues, Weds: 8am - 5pm; Fri: 8-1 Medicaid - Yes; Tricare -  yes  [facility] Rachel Bo, MD; Laural Benes, MD; Anner Crete, MD; [place], [place]; [place], [place] [address] [place], [place] [address] [phone] M-F 8:30 - 5:00 Medicaid - Call office; Tricare -yes  [facility] Edson Snowball, MD; Shanon Rosser, MD, Chelsea Primus, MD; Shirlyn Goltz, PNP; Wardell Heath, NP [phone] [address], [place], [place] [address] [phone] M-F 8:30 - 5:00, Sat/Sun 8:30 - 12:30 (sick visits) Medicaid - Call office; Tricare -yes  [facility] Melvyn Neth, MD; Karl Luke, PNP; Princess Bruins, MD; [place], [place]; McSwain, NP; Cynda Familia [address], [place], [place] [address] [phone] M-F 8:30 - 5:00 Medicaid - Call office; Tricare - yes  [facility] Jesusita Oka, MD; Dierdre Highman, MD; Earnest Conroy, MD; Timothy Lasso, MD; Nogo, MD [phone] [address] [place], [place] [address] [phone] M-Thur: 8:00 - 5:00; Fri: 8:00 - 4:00 Medicaid - yes; Tricare - yes  [facility] [address] [place], [place] [address] [phone] M-F: 8:30- 5:00, closed for lunch 12:30 - 1:00 Medicaid - yes; Tricare -yes  [facility] [address] [place], [place] [address] [phone] M-F 8:00 - 5:00 Medicaid - yes; Tricare - yes  [facility] Opal, DO; [place], DO; [place], NP [address], [place], [place] [address] [phone] M-F 8:00 - 5:00, Closed 12-1 for lunch Medicaid - Call; Tricare - yes  [facility] Meredith Mody, MD [address] [place], [place] [address] [phone] M-F: 8:00-5:00, Sat: 8:00 - noon Medicaid - call; Tricare -yes  [facility] Pediatric Providers  [facility] [place], [place] [address] [place], [place], [place] [address] [phone] M-W: 8:00-5:00, Thur: 8:00 - 7:00, Fri: 8:00 - noon Medicaid - yes; Tricare - yes  [facility] - Quay Burow, FNP [address], [place],  [place] [address] [phone] M-F 8:00 - 5:00, Closed for lunch 12-1 Medicaid -  yes; Tricare - yes  [facility] Pediatric Providers  [facility] [place], [place], Alinda Money, MD, [place], FNP-C [address] [facility], [address], [place], [place] [address] [phone] M-T 8:00-5:00, Wed-Fri 7:00-6:00 Medicaid - Yes; Tricare -yes  [facility] at [facility], [place]; [address], [address] [place], [place] [address] [phone] M-F 8:00 - 5:00, closed for lunch 12-1 Medicaid - Yes; Tricare - yes  [facility]s and Internal Medicine  Zachery Dauer, MD; Gladstone Lighter, MD; Collie Siad, MD; Freda Jackson, MD; Rich Number, MD; Darryl Nestle, MD; Melinda Crutch, MD, Audria Nine, MD; Tawanna Cooler, MD; Steffanie Dunn, MD; Byrd Hesselbach, MD; Lucretia Roers, MD [address] [place], [place] [address] [phone] M-F 8:00-5:00 Medicaid - yes; Tricare - yes  [facility] [place], MD (speaks [place] and Hindi) [address] [place], [place] [address] [phone] M-F: 8:30 - 5:00, closed 12:30 - 1 for lunch Medicaid - Yes; Tricare -yes  [facility] Pediatric Providers  [facility] Shanda Bumps, MD; Chanetta Marshall, MD; Laurell Josephs, MD [address] [place], [place] [address] [phone] M-Th: 8:00 - 5:30, Fri: 8:00 - 12:00 Medicaid - yes; Tricare - yes  [facility]s at [facility], NP; Thora Lance, MD; Orrin Brigham, MD [phone] [address] [place], [place] [address] [phone] M-F: 8:00 - 5:00 Medicaid - yes; Tricare - yes  [facility] Wh[place], NP; Orson Slick, NP; Salley Scarlet, NP; Linton Flemings, MD; Mayford Knife, MD, [place], NP, Emelda Fear, MD; Nida Boatman [address], [place], [place] [address] [phone] M-F: 8:30 - 5:30p Medicaid - yes; Tricare - yes Other locations available as well  [facility], MD; Andrey Campanile, MD; Neville Route, PA-C [address] [place], [place] [address] [phone] M-W: 8:00am - 7:00pm, Thurs: 8:00am - 8:00pm; Fri: 8:00am -  5:00pm, closed daily from 12-1 for lunch Medicaid - yes; Tricare - yes  [facility] Pediatric Providers  [facility] at Levin Erp, MD; Aggie Cosier, FNP; Bland Span, MD; Tristan Schroeder, MD; [place], PNP; Alesia Banda; [place], [place]; Julian Reil, MD;  [address], [place], [place] [address] [phone] Judie Petit - Caleen Essex: 8am - 5pm, Sat 9-noon Medicaid - Yes; Tricare -yes  [facility] at Jaclynn Guarneri, MD; Yetta Barre, FNP; Lilian Kapur, MD; Mariam Dollar, MD [address] [place], [place] [phone] M-F 8:00 - 5:00 Medicaid - call; Tricare - yes  [facility]- Cruz Condon, MD; [place], [place]; Delora Fuel, MD; Dareen Piano, MD; Trudee Grip, MD; Kizzie Ide, MD; Zebedee Iba; Birdena Crandall, MD; Hinton Dyer, MD; [place], MD [address], [place], [place] [address] [phone] M-F 8:00am - 5:00pm; Sat. 9:00 - 11:00 Medicaid - yes; Tricare - yes  [facility] at [facility], MD [address] [place], [place] [address] [phone] M-F 8:00 - 5:00 Medicaid - [place] Medicaid only; Tricare - yes  [facility] - Illene Bolus, MD; Earlene [place], [place]; Kenyon Ana, MD [address], [place], [place] [address] [phone] M-F 8:00 - 5:00 Medicaid - yes; Tricare - yes  [facility] [address] Pediatrics - Lind Covert, MD; Manson Passey, MD, [facility], MD, [place], MD; [place], MD; Katrinka Blazing, MD; [address] [place], [place] [address] [phone] M-F: 8-5 Medicaid - yes; Tricare - yes  [facility] - Henrietta Hoover, [place]; [place], MD; [address], [place], [place] [address] [phone] M-F 8-5 Medicaid - yes; Tricare - yes  [address] [place] Darrol Poke, MD; Tami Ribas, MD;  [place], MD; [place], DNP; [place], PNP [address], [place], [place] [address] [phone] M-F 8-5 Medicaid - yes; Tricare - yes  [facility] and Pediatrics Delories Heinz, MD;  Adrienne Mocha; Ala Bent, MD [address] [place], [place] [address] [phone] M-F 7am - 5 pm Medicaid - call; Tricare - yes  [facility] Pa Elliott, [place]; Fredia Beets, MD; Roxan Hockey, MD [address] [place], [place] [address] [phone] M-F 8-5 Medicaid - yes; Tricare - yes  [facility] Kae Heller, MD; Sheliah Hatch, MD; Mayford Knife, FNP; Shon Baton, FNP; Tyron Russell, FNP; Ishmael Holter; [facility] - FNP [address], [place], [place] [address] [phone] M-F 8-5 Medicaid- yes; Tricare - yes  [facility] - Betsy Coder, [place] and Chalmers Guest, MD; Terrial Rhodes, MD; Hulda Humphrey, MD; Roseanne Reno, MD; [place], [place]; Ala Dach, MD; Fredia Beets, MD; Dimple Casey, MD [address] [place], [place] [address] [phone] M-F: 8-5, Sat: 9-4, Sun 9-12 Medicaid - yes; Tricare - yes  [facility] Little, PNP; Earlene [place], PNP [address] [place], [place] [address] [phone] M-F 8 - 5, closed 12-1 for lunch Medicaid - yes; Tricare - yes  [facility] Kathyrn Lass, MD; Hal Neer, MD; Dimple Casey, MD; Keo, DO [address] [place], [place] [address] [phone] M-F 8- 5:30 Medicaid - yes; Tricare - yes  [facility]s - Biagio Quint, MD; Rosalia Hammers, MD; Gwenith Daily, MD [address], [place], [place] [address] [phone] Judie Petit: Nicholas Lose; Tues-Fri: 8-5; Sat: 9-12 Medicaid - yes; Tricare - yes  [facility] Pediatrics - Bobbye Morton, MD; Daphane Shepherd, MD; Chestine Spore, MD; Haskell Riling, MD; Kate Sable, MD [address] [place], [place] [address] [phone] Judie PetitMarland Kitchen Nicholas LoseFrancee Nodal: 8-5; Sat: 8:30-12:30 Medicaid - yes; Tricare - yes  [facility] - Beckey Rutter, MD; [place], [place] [address] [place], [place] [address] [phone] Mon-Fri: 8-5 Medicaid - yes;  Tricare - yes  [facility] Pediatrics - [place] Friedens, CPNP; [place], [place]; Dimple Casey, MD; Alisa Graff, MD; Cephus Shelling, MD; [address], [place], [place] [address] [phone] M-F: 8-5, closed 1-2 for lunch Medicaid - yes; Tricare - yes  [facility]s - [facility] [place], [place]; [place], [place]; Katrinka Blazing, MD; [place], CPNP; [place], [place]; [place], MD; Earlene [place], MD [address], [place], [place] [address] [phone] M-Thurs: Nicholas Lose; Fri: 8-6; Sat: 9-12; Sun 2-4 Medicaid - yes; Tricare - yes  [facility] Georgeanna Lea, MD; Evette Cristal, MD; Shea Stakes, FNP; Earney Mallet, DO; [address], [place], [place] [address] [phone] M-F: 8-5 Medicaid - yes; Tricare - yes  [facility] Pediatric Providers  [facility] Family Medicine -Collene Mares, MD; [place], NP [address], [place], [place] [address] [phone] M - Fri: 8am - 5pm, closed for lunch 12-1 Medicaid - Yes; Tricare - yes  [facility] Elinor Parkinson, MD; Victory Dakin, MD; [place], DO; Vinocur, MD;Hall, PA; Clent Ridges, [place]; Orvan Falconer, NP [phone] [address], [place], [place] [address] [phone] M-F 8:00 - 5:00, Sat 8:00 - 11:30 Medicaid - yes; Tricare - yes  [facility] Welton Flakes, MD; [place], MD, [address] MD, [place], MD, McPherson, MD; [place], NP; [place], [place];  [address] [place], [place] [address] [phone] M-F 8:10am - 5:00pm Medicaid - yes; Tricare - yes  [facility] Homer, MD; [place], NP [address] [place], [place] [address] [phone] M-F 8:00 - 5:00 Medicaid - [place] Medicaid only; Tricare - yes  [facility] Medicine - Deep [address] [place]ert[place], [place]; [place], NP [address], [place], [place] [address] [phone] M-F 8:00 - 5:00; Closed for lunch 12 - 1:00 Medicaid - yes;  Tricare - yes  [facility] Belva Crome, MD; Jonita Albee, FNP [address], [place], [place] [address] [phone] Mon 9-5; Tues/Wed 10-5; Thurs 8:30-5; Fri: 8-12:30 Medicaid - yes; Tricare - yes  [facility] Pediatric Providers  [facility]  [place], MD; [place], [place] [address], [place], [place] [address] [phone] phone [phone] fax M-F 7:15 - 4:30 Medicaid - yes; Tricare - yes  [place] Karilyn Cota, MD; [place], DO [address] [place], [place] [address] [phone] M-Fri: 8:30 - 5:00, closed for lunch everyday noon - 1pm Medicaid - Yes; Tricare - yes  [facility] Burdine, MD; Reuel Boom, MD; Dimas Aguas, MD; Neita Carp, MD; [place], [place]; Bonnita Nasuti, [place]; [place], [place]; [place], [place]; [place], [place] [address] [place], [place] [address] [phone] M-Thurs: 7:30am - 7:00pm; Friday 7:30am - 4pm; Sat: 8:00 - 1:00 Medicaid - Yes; Tricare - yes  [facility] Norval Morton, MD; Conni Elliot, MD; Carroll Kinds, MD; [place], DO [address], [place], [place] [address] [phone] M-Thur: 8:00 - 5:00, Fri: 8:00 - Noon Medicaid - yes; Tricare - yes No [place] [employer]  [place] [facility] Medicine Dettinger, MD; Nadine Counts, DO; [place], NP; Daphine Deutscher, NP; Lequita Halt, NP; Ellamae Sia, NP; Reginia Forts, NP; Darlyn Read, MD; [place], [place] [address] [place], [place] [address] [phone] M-F 8:00 - 5:00 Medicaid - yes; Tricare - yes  [facility], FNP-C; [place], FNP-C [address] [place] [place], [place] [address] [phone] M, W, R 8:00-5:00, Tues: 8:00am - 7:00pm; Fri 8:00 - noon Medicaid - Yes; Tricare - yes  [facility], MD [address] [place], [place] [address] [phone]  M-Thurs 8:30-5:30, Fri: 8:30-12:30pm Medicaid - Yes; Tricare - N  .We highly recommend childbirth education to help you plan for labor and begin  practicing coping skills (which will be needed with or without pain meds).  [place] Childbirth Education Options: Sign up by visiting [email or web address]  Childbirth ~ Self-Paced eClass (English and Spanish) This online class offers you the freedom to complete a childbirth education series in the comfort of your own home at your own pace.  Childbirth Class (In-Person 4-Week Series  or on Saturdays, Virtual 4-Week Series ~ [place]) This interactive in-person class series will help you and your partner prepare for your birth experience. Topics include: Labor & Birth, Comfort Measures, Breathing Techniques, Massage, Medical Interventions, Pain Management Options, Cesarean Birth, Postpartum Care, and Newborn Care  Comfort Techniques for Labor ~ In-Person Class [facility]) This interactive class is designed for parents-to-be who want to learn & practice hands-on skills to help relieve some of the discomfort of labor and encourage their babies to rotate toward the best position for birth. Moms and their partners will be able to try a variety of labor positions with birth balls and rebozos as well as practice breathing, relaxation, and visualization techniques.  Natural Childbirth Class (In-Person 5-Week Series, In-Person on Saturdays or Virtual 5-Week Series ~ [place]) This class series is designed for expectant parents who want to learn and  practice natural methods of coping with the process of labor and childbirth.  Cesarean Birth Self-Paced eClass (English and Spanish) This online course provides comprehensive information you can trust as you prepare for a possible cesarean birth. In this class, you'll learn how to make your birth and recovery comfortable and joyful through instructive video clips, animations, and activities.  Waterbirth ~ Airline pilot Interested in a waterbirth? In addition to a consultation with your credentialed waterbirth provider, this free, informational online  class will help you discover whether waterbirth is the right fit for you. Not all obstetrical practices offer waterbirth, so check with your healthcare provider.  Tour Probation officer) - [facility] [employer] our 4 minute video tour of [facility] located in [place].   [place] Parenting Education Options:  Pregnancy 101 (Virtual) Congratulations on your pregnancy! This class is geared toward moms in their first trimester, but everyone is welcome. We are excited to guide you through all aspects of supporting a healthy pregnancy. You will learn what to expect at routine prenatal care appointments, common postpartum adjustments, basic infant safety, and breastfeeding.  Successful Partnering & Parenting ~ In-Person Workshop [facility]) This workshop inspires and equips partners of all economic levels, ages, and cultures to confidently care for their infants, support the birthing persons, and navigate their own transformations into new partners and parents. Learning activities are geared towards supporting partner, but moms are welcome to attend.  'Baby & Me' Parenting Group (Virtual on Wednesdays at 11am) Enjoy this time discussing newborn & infant parenting topics and family adjustment issues with other new parents in a relaxed environment. Each week brings a new speaker or baby-centered activity. This group offers support and connection to parents as they journey through the adjustments and struggles of that sometimes overwhelming first year after the birth of a child.  Baby Safety, CPR, & Choking Class ~ Virtual This life-saving information is meant to encourage parents as they learn important safety and prevention tips as well as infant CPR and relief of choking.  Breastfeeding Class (In-Person in [place] or [employer]) Families learn what to expect in the first days and weeks of breastfeeding your newborn. IF YOU ARE AN EMPLOYEE  TAKING THIS CLASS FOR CREDIT, DO NOT register yourself. Please e-mail [email or web address].   Breastfeeding Self-Paced eClass (English & Spanish) Families learn what to expect in the first days and weeks of breastfeeding your newborn.  Caring for Baby ~ In-Person, Virtual or Self-Paced Class This in-person class is for both expectant and adoptive parents who want to learn and practice the most up-to-date newborn care for their babies. Focus is on birth through the first six weeks of life.  CPR & Choking Relief for Infants & Children ~ In-Person Class [facility]) This in-person course is designed for any parent, expectant parent, or adult who cares for infants or children. Participants learn and demonstrate cardiopulmonary resuscitation and choking relief procedures for both infants and children.  Grandparent Love ~ In-Person Class Grandparents will learn the most updated infant care and safety recommendations. They will discover ways to support their own children during the transition into the parenting role and receive tips on communicating with the new parents.  [place] Parenting Support Group Options:  Bereavement Grief Support Group (Pregnancy/Infant Loss) - Virtual This is an ongoing experience that meets once a month and is designed to help you honor the past, assist you in discovering tools to strengthen you today, and aid you in developing  [place] for the future.  Breastfeeding & Pumping Support Group (In-Person on Thursdays at 12pm or Virtual on Tuesdays at 5pm) Join [place] in-person each Thursday starting June 1st, 2023 at 12pm! This support group is free for all families looking for breastfeeding and/or pumping support.   Community-Based Childbirth Education Options:  [facility] Department Classes:  Childbirth education classes can help you get ready for a positive parenting experience. You can also meet other expectant parents and get free stuff for your baby.  Each class runs for five weeks on the same night and costs $45 for the mother-to-be and her support person. Medicaid covers the cost if you are eligible. Call [phone] to register.  [facility] offers a variety of programs for the [employer] and is another great way to get connected. Please go to [email or web address] for more information.  Childbirth With A Twist! Be informed of your options, get educated on birth, understand what your body is doing, learn how to cope, and have a lot of fun and laughs all while doing it either from the comfort of your couch OR in our cozy office and classroom space near the [place] airport. If you are taking a virtual class, then class is taught LIVE, so you can ask questions and receive answers in real-time from an experienced doula and childbirth educator.  This virtual childbirth education class will meet for five instruction times online.  Although we are based in [place], [place], this virtual class is open to anyone in the world. Please visit: [email or web address] for more information.  Books We Love: The Doula Guide to Childbirth by Harland German and Otila Back The First-Time Parent's Childbirth Handbook by Dr. Amie Critchley, CNM The Birth Partner by Truddie Crumble

## 2023-03-17 NOTE — Progress Notes (Unsigned)
INITIAL PRENATAL VISIT  Subjective:   Emily Garrison is being seen today for her first obstetrical visit.  She is at [redacted]w[redacted]d gestation by LMP Her obstetrical history is significant for  cesarean section . Relationship with FOB: spouse, living together. Patient does intend to breast feed. Pregnancy history fully reviewed.  Patient reports no complaints.  Indications for ASA therapy (per uptodate)  Objective:    Obstetric History OB History  Gravida Para Term Preterm AB Living  3 1 1  0 1 1  SAB IAB Ectopic Multiple Live Births  0 1 0 0 1    # Outcome Date GA Lbr Len/2nd Weight Sex Type Anes PTL Lv  3 Current           2 Term 2012 [redacted]w[redacted]d   F CS-Unspec   LIV     Complications: Failure to Progress in First Stage  1 IAB             Past Medical History:  Diagnosis Date   Anemia of pregnancy 03/12/2023   Breast cyst    Cyst of breast 03/12/2023   Herpes simplex 03/12/2023   Morbid obesity (HCC) 03/12/2023   PVC (premature ventricular contraction) 05/13/2021   Vaginal Pap smear, abnormal     Past Surgical History:  Procedure Laterality Date   CESAREAN SECTION     removal of breast cyst      Current Outpatient Medications on File Prior to Visit  Medication Sig Dispense Refill   Prenatal Vit-Fe Fumarate-FA (MULTIVITAMIN-PRENATAL) 27-0.8 MG TABS tablet Take 1 tablet by mouth daily at 12 noon.     No current facility-administered medications on file prior to visit.    Allergies  Allergen Reactions   Latex Hives and Other (See Comments)    Social History:  reports that she quit smoking about 2 years ago. Her smoking use included cigarettes. She started smoking about 12 years ago. She has a 2.5 pack-year smoking history. She has never used smokeless tobacco. She reports that she does not currently use alcohol. She reports that she does not use drugs.  Family History  Problem Relation Age of Onset   Diabetes Father    Dementia Maternal Grandmother    Stroke Paternal  Grandmother    Stroke Paternal Aunt     The following portions of the patient's history were reviewed and updated as appropriate: allergies, current medications, past family history, past medical history, past social history, past surgical history and problem list.  Review of Systems Review of Systems    Physical Exam:  BP 137/82   Pulse 76   Wt (!) 334 lb 14.4 oz (151.9 kg)   LMP 01/03/2023   BMI 59.32 kg/m  CONSTITUTIONAL: Well-developed, well-nourished female in no acute distress.  HENT:  Normocephalic, atraumatic.  Oropharynx is clear and moist EYES: Conjunctivae normal. No scleral icterus.  NECK: Normal range of motion, supple, no masses.  Normal thyroid.  SKIN: Skin is warm and dry. No rash noted. Not diaphoretic. No erythema. No pallor. MUSCULOSKELETAL: Normal range of motion. No tenderness.  No cyanosis, clubbing, or edema.   NEUROLOGIC: Alert and oriented  PSYCHIATRIC: Normal mood and affect. CARDIOVASCULAR: Normal heart rate noted RESPIRATORY: normal effort  ABDOMEN: Soft PELVIC: deferred              Assessment:    Pregnancy: G3P1011  1. Encounter for supervision of low-risk pregnancy, antepartum (Primary) BP normal FHR visualized on bedside u/s an showed to pt  Discussed recommendation for asa  during pregnancy, rx sent to pharmacy  2. History of C-section 41 wks, was not progressing past 7, then emergent for [facility]  Desires tolac  3. [redacted] weeks gestation of pregnancy  - PANORAMA PRENATAL TEST - HORIZON Basic Panel - CBC/D/Plt+RPR+Rh+ABO+RubIgG... - Culture, OB Urine - HgB A1c - Comp Met (CMET) - Protein / creatinine ratio, urine - Cervicovaginal ancillary only - aspirin EC 81 MG tablet; Take 1 tablet (81 mg total) by mouth daily. Start taking when you are [redacted] weeks pregnant for rest of pregnancy for prevention of preeclampsia (Patient not taking: Reported on 03/18/2023)  Dispense: 300 tablet; Refill: 2     Initial labs drawn. Prenatal  vitamins. Problem list reviewed and updated. Reviewed in detail the nature of the practice with collaborative care between  Genetic screening discussed: NIPS/First trimester screen/Quad/AFP ordered. Role of ultrasound in pregnancy discussed; Anatomy US: ordered.  Follow up in 4 weeks. Discussed clinic routines, schedule of care and testing, genetic screening options, involvement of students and residents under the direct supervision of APPs and doctors and presence of female providers. Pt verbalized understanding.  Return in 4 weeks     Sue Lush, FNP

## 2023-03-18 ENCOUNTER — Telehealth: Payer: Self-pay | Admitting: Obstetrics and Gynecology

## 2023-03-18 ENCOUNTER — Other Ambulatory Visit: Payer: Self-pay | Admitting: Obstetrics and Gynecology

## 2023-03-18 ENCOUNTER — Telehealth: Payer: Self-pay

## 2023-03-18 ENCOUNTER — Ambulatory Visit (INDEPENDENT_AMBULATORY_CARE_PROVIDER_SITE_OTHER): Payer: Commercial Managed Care - HMO | Admitting: Obstetrics and Gynecology

## 2023-03-18 VITALS — BP 123/81 | HR 82 | Wt 329.8 lb

## 2023-03-18 DIAGNOSIS — Z01812 Encounter for preprocedural laboratory examination: Secondary | ICD-10-CM

## 2023-03-18 DIAGNOSIS — O039 Complete or unspecified spontaneous abortion without complication: Secondary | ICD-10-CM | POA: Diagnosis not present

## 2023-03-18 DIAGNOSIS — Z3A09 9 weeks gestation of pregnancy: Secondary | ICD-10-CM | POA: Diagnosis not present

## 2023-03-18 LAB — CBC/D/PLT+RPR+RH+ABO+RUBIGG...
Antibody Screen: NEGATIVE
Basophils Absolute: 0 10*3/uL (ref 0.0–0.2)
Basos: 0 %
EOS (ABSOLUTE): 0 10*3/uL (ref 0.0–0.4)
Eos: 1 %
HCV Ab: NONREACTIVE
HIV Screen 4th Generation wRfx: NONREACTIVE
Hematocrit: 36.2 % (ref 34.0–46.6)
Hemoglobin: 11.5 g/dL (ref 11.1–15.9)
Hepatitis B Surface Ag: NEGATIVE
Immature Grans (Abs): 0 10*3/uL (ref 0.0–0.1)
Immature Granulocytes: 1 %
Lymphocytes Absolute: 1.3 10*3/uL (ref 0.7–3.1)
Lymphs: 22 %
MCH: 29.9 pg (ref 26.6–33.0)
MCHC: 31.8 g/dL (ref 31.5–35.7)
MCV: 94 fL (ref 79–97)
Monocytes Absolute: 0.5 10*3/uL (ref 0.1–0.9)
Monocytes: 9 %
Neutrophils Absolute: 3.9 10*3/uL (ref 1.4–7.0)
Neutrophils: 67 %
Platelets: 297 10*3/uL (ref 150–450)
RBC: 3.85 x10E6/uL (ref 3.77–5.28)
RDW: 12.9 % (ref 11.7–15.4)
RPR Ser Ql: NONREACTIVE
Rh Factor: POSITIVE
Rubella Antibodies, IGG: 29.1 {index}
WBC: 5.8 10*3/uL (ref 3.4–10.8)

## 2023-03-18 LAB — COMPREHENSIVE METABOLIC PANEL WITH GFR
ALT: 22 [IU]/L (ref 0–32)
AST: 17 [IU]/L (ref 0–40)
Albumin: 4 g/dL (ref 3.9–4.9)
Alkaline Phosphatase: 73 [IU]/L (ref 44–121)
BUN/Creatinine Ratio: 15 (ref 9–23)
BUN: 11 mg/dL (ref 6–20)
Bilirubin Total: 0.3 mg/dL (ref 0.0–1.2)
CO2: 19 mmol/L — ABNORMAL LOW (ref 20–29)
Calcium: 9.5 mg/dL (ref 8.7–10.2)
Chloride: 105 mmol/L (ref 96–106)
Creatinine, Ser: 0.74 mg/dL (ref 0.57–1.00)
Globulin, Total: 2.8 g/dL (ref 1.5–4.5)
Glucose: 82 mg/dL (ref 70–99)
Potassium: 4 mmol/L (ref 3.5–5.2)
Sodium: 141 mmol/L (ref 134–144)
Total Protein: 6.8 g/dL (ref 6.0–8.5)
eGFR: 108 mL/min/{1.73_m2}

## 2023-03-18 LAB — PROTEIN / CREATININE RATIO, URINE
Creatinine, Urine: 52 mg/dL
Protein, Ur: 8 mg/dL
Protein/Creat Ratio: 154 mg/g{creat} (ref 0–200)

## 2023-03-18 LAB — HEMOGLOBIN A1C
Est. average glucose Bld gHb Est-mCnc: 114 mg/dL
Hgb A1c MFr Bld: 5.6 % (ref 4.8–5.6)

## 2023-03-18 LAB — CERVICOVAGINAL ANCILLARY ONLY
Chlamydia: NEGATIVE
Comment: NEGATIVE
Comment: NORMAL
Neisseria Gonorrhea: NEGATIVE

## 2023-03-18 LAB — HCV INTERPRETATION

## 2023-03-18 NOTE — Telephone Encounter (Signed)
 Called patient to provide surgery details for procedure on 03/20/23 @11 :15 am. Patient confirmed she was able to arrive at [facility] at 9:15 am for pre-op. I provide surgery and pre-op information by phone and written details will be sent to patient Mychart acct.

## 2023-03-18 NOTE — Progress Notes (Signed)
   GYNECOLOGY PROGRESS NOTE  History:  38 y.o. G3P1011 presents to [facility] for follow up ultrasound results. Dating u/s yesterday afternoon revealed IUP, CRL of [redacted]w[redacted]d, with no cardiac activity visualized.    Health Maintenance Due  Topic Date Due   DTaP/Tdap/Td (1 - Tdap) Never done   Cervical Cancer Screening (HPV/Pap Cotest)  12/19/2018   INFLUENZA VACCINE  Never done   COVID-19 Vaccine (2 - 2024-25 season) 10/13/2022     Review of Systems:  Pertinent items are noted in HPI.   Objective:  Physical Exam Last menstrual period 01/03/2023. VS reviewed, nursing note reviewed,  Constitutional: well developed, no distress HEENT: normocephalic CV: normal rate Pulm/chest wall: normal effort Abdomen: soft Neuro: alert and oriented , appropriate for situation Psych: affect normal Pelvic exam: deferred  Assessment & Plan:  1. Miscarriage at 8 to [redacted] weeks gestation (Primary) Offered condolences. Reviewed u/s results, would prefer follow up u/s Dr. Ilean offered repeat u/s in clinic today, no heart tones or movement visualized  Reassured patient that there is nothing she did or did not do to cause this. Reviewed most common reason is presumed to be genetic abnormalities that allow a pregnancy to start but not continue past an early stage, but realistically we do not know the cause in most cases.  Reviewed options of medical, or surgical management. After counseling she elected for surgical management. Message sent to scheduler. Advised patient to expect phone call in the next few days. . Sent a message to scheduler as well. Blood type O/Positive/-- (02/03 1125), We discussed return precautions including crescendo abdominal pain, heavy vaginal bleeding soaking >1 pad/hour, and fever. Declined counseling services at this time. Will schedule follow up appt with provider after D&E  Nidia Daring, FNP

## 2023-03-18 NOTE — H&P (Signed)
 Emily Garrison is an 38 y.o. female G65P1011 with a recently diagnosed missed abortion here for scheduled dilatation and evacuation. Patient with ultrasound findings consistent with a missed abortion measuring [redacted]w[redacted]d. Patient denies any vaginal bleeding or cramping pain.    Menstrual History: Patient's last menstrual period was 01/03/2023.    Past Medical History:  Diagnosis Date   Anemia of pregnancy 03/12/2023   Anxiety    Breast cyst    Cyst of breast 03/12/2023   Herpes simplex 03/12/2023   Morbid obesity (HCC) 03/12/2023   PVC (premature ventricular contraction) 05/13/2021   Vaginal Pap smear, abnormal     Past Surgical History:  Procedure Laterality Date   CESAREAN SECTION     removal of breast cyst      Family History  Problem Relation Age of Onset   Diabetes Father    Dementia Maternal Grandmother    Stroke Paternal Grandmother    Stroke Paternal Aunt     Social History:  reports that she quit smoking about 2 years ago. Her smoking use included cigarettes. She started smoking about 12 years ago. She has a 2.5 pack-year smoking history. She has never used smokeless tobacco. She reports that she does not currently use alcohol. She reports that she does not use drugs.  Allergies:  Allergies  Allergen Reactions   Latex Hives and Other (See Comments)    Medications Prior to Admission  Medication Sig Dispense Refill Last Dose/Taking   Prenatal Vit-Fe Fumarate-FA (MULTIVITAMIN-PRENATAL) 27-0.8 MG TABS tablet Take 1 tablet by mouth daily at 12 noon.   03/16/2023   aspirin  EC 81 MG tablet Take 1 tablet (81 mg total) by mouth daily. Start taking when you are [redacted] weeks pregnant for rest of pregnancy for prevention of preeclampsia (Patient not taking: Reported on 03/18/2023) 300 tablet 2 Not Taking    Review of Systems See pertinent in HPI. All other systems reviewed and non contributory Blood pressure 128/63, pulse 75, temperature 98.4 F (36.9 C), temperature source Oral,  resp. rate 18, height 5' 3 (1.6 m), weight (!) 141.5 kg, last menstrual period 01/03/2023, SpO2 100%. Physical Exam GENERAL: Well-developed, well-nourished female in no acute distress.  LUNGS: Clear to auscultation bilaterally.  HEART: Regular rate and rhythm. ABDOMEN: Soft, nontender, nondistended. No organomegaly. PELVIC: Deferred to OR EXTREMITIES: No cyanosis, clubbing, or edema, 2+ distal pulses.  Results for orders placed or performed during the hospital encounter of 03/20/23 (from the past 24 hours)  Type and screen     Status: None (Preliminary result)   Collection Time: 03/20/23  9:21 AM  Result Value Ref Range   ABO/RH(D) PENDING    Antibody Screen PENDING    Sample Expiration      03/23/2023,2359 Performed at [facility], [address], [place], [place] [address]   CBC     Status: Abnormal   Collection Time: 03/20/23  9:22 AM  Result Value Ref Range   WBC 5.2 4.0 - 10.5 K/uL   RBC 3.92 3.87 - 5.11 MIL/uL   Hemoglobin 12.0 12.0 - 15.0 g/dL   HCT 65.6 (L) 63.9 - 53.9 %   MCV 87.5 80.0 - 100.0 fL   MCH 30.6 26.0 - 34.0 pg   MCHC 35.0 30.0 - 36.0 g/dL   RDW 86.2 88.4 - 84.4 %   Platelets 304 150 - 400 K/uL   nRBC 0.0 0.0 - 0.2 %    No results found.   Assessment/Plan: 38 yo with [redacted]w[redacted]d missed abortion here  for scheduled D&E - risks, benefits and alternatives were explained including but not limited to risks of bleeding, infection, uterine perforationand damage to adjacent organs. - Patient verbalized understanding and all questions were answered - Patient desires chromosomal studies  Emily Garrison 03/20/2023, 10:32 AM

## 2023-03-18 NOTE — Telephone Encounter (Signed)
Telephone encounter.

## 2023-03-19 ENCOUNTER — Other Ambulatory Visit: Payer: Self-pay

## 2023-03-19 ENCOUNTER — Encounter (HOSPITAL_COMMUNITY): Payer: Self-pay | Admitting: Obstetrics and Gynecology

## 2023-03-19 LAB — URINE CULTURE, OB REFLEX

## 2023-03-19 LAB — CULTURE, OB URINE

## 2023-03-19 NOTE — Progress Notes (Signed)
 PCP - Ilah Crigler, MD  Cardiologist -   PPM/ICD - denies Device Orders - n/a Rep Notified - n/a  Chest x-ray -  EKG - 05-15-21 ( per patient experienced episode of palpitations wear monitor per patient no heart issues just anxiety. Can follow up as needed) Stress Test -  ECHO - 05-23-21 Cardiac Cath -   CPAP - denies  DM - denies  Blood Thinner Instructions: denies Aspirin  Instructions:  no longer taking  ERAS Protcol - NPO  COVID TEST- n/a  Anesthesia review: no  Patient verbally denies any shortness of breath, fever, cough and chest pain during phone call   -------------  SDW INSTRUCTIONS given:  Your procedure is scheduled on March 20, 2023.  Report to [facility] Entr[facility] A at 9:00 A.M., and check in at the Admitting office.  Call this number if you have problems the morning of surgery:  [phone]   Remember:  Do not eat or drink after midnight the night before your surgery      Take these medicines the morning of surgery with A SIP OF WATER NONE  As of today, STOP taking any Aspirin  (unless otherwise instructed by your surgeon) Aleve, Naproxen, Ibuprofen , Motrin , Advil , Goody's, BC's, all herbal medications, fish oil, and all vitamins.                      Do not wear jewelry, make up, or nail polish            Do not wear lotions, powders, perfumes/colognes, or deodorant.            Do not shave 48 hours prior to surgery.  Men may shave face and neck.            Do not bring valuables to the hospital.            [facility] is not responsible for any belongings or valuables.  Do NOT Smoke (Tobacco/Vaping) 24 hours prior to your procedure If you use a CPAP at night, you may bring all equipment for your overnight stay.   Contacts, glasses, dentures or bridgework may not be worn into surgery.      For patients admitted to the hospital, discharge time will be determined by your treatment team.   Patients discharged the day of surgery will not be  allowed to drive home, and someone needs to stay with them for 24 hours.    Special instructions:   [place]- Preparing For Surgery  Before surgery, you can play an important role. Because skin is not sterile, your skin needs to be as free of germs as possible. You can reduce the number of germs on your skin by washing with CHG (chlorahexidine gluconate) Soap before surgery.  CHG is an antiseptic cleaner which kills germs and bonds with the skin to continue killing germs even after washing.    Oral Hygiene is also important to reduce your risk of infection.  Remember - BRUSH YOUR TEETH THE MORNING OF SURGERY WITH YOUR REGULAR TOOTHPASTE  Please do not use if you have an allergy to CHG or antibacterial soaps. If your skin becomes reddened/irritated stop using the CHG.  Do not shave (including legs and underarms) for at least 48 hours prior to first CHG shower. It is OK to shave your face.  Please follow these instructions carefully.   Shower the NIGHT BEFORE SURGERY and the MORNING OF SURGERY with DIAL Soap.   Pat yourself dry  with a CLEAN TOWEL.  Wear CLEAN PAJAMAS to bed the night before surgery  Place CLEAN SHEETS on your bed the night of your first shower and DO NOT SLEEP WITH PETS.   Day of Surgery: Please shower morning of surgery  Wear Clean/Comfortable clothing the morning of surgery Do not apply any deodorants/lotions.   Remember to brush your teeth WITH YOUR REGULAR TOOTHPASTE.   Questions were answered. Patient verbalized understanding of instructions.

## 2023-03-20 ENCOUNTER — Ambulatory Visit (HOSPITAL_COMMUNITY)
Admission: RE | Admit: 2023-03-20 | Discharge: 2023-03-20 | Disposition: A | Discharge status: Home / Self Care | Payer: Medicaid Other | Attending: Obstetrics and Gynecology | Admitting: Obstetrics and Gynecology

## 2023-03-20 ENCOUNTER — Ambulatory Visit (HOSPITAL_BASED_OUTPATIENT_CLINIC_OR_DEPARTMENT_OTHER): Payer: Medicaid Other | Admitting: Anesthesiology

## 2023-03-20 ENCOUNTER — Encounter (HOSPITAL_COMMUNITY): Payer: Self-pay | Admitting: Obstetrics and Gynecology

## 2023-03-20 ENCOUNTER — Other Ambulatory Visit: Payer: Self-pay

## 2023-03-20 ENCOUNTER — Ambulatory Visit (HOSPITAL_COMMUNITY): Payer: Medicaid Other | Admitting: Anesthesiology

## 2023-03-20 ENCOUNTER — Encounter (HOSPITAL_COMMUNITY)
Admission: RE | Disposition: A | Discharge status: Home / Self Care | Payer: Self-pay | Source: Home / Self Care | Attending: Obstetrics and Gynecology

## 2023-03-20 DIAGNOSIS — O021 Missed abortion: Secondary | ICD-10-CM | POA: Diagnosis present

## 2023-03-20 DIAGNOSIS — E6689 Other obesity not elsewhere classified: Secondary | ICD-10-CM | POA: Diagnosis not present

## 2023-03-20 DIAGNOSIS — F419 Anxiety disorder, unspecified: Secondary | ICD-10-CM | POA: Diagnosis not present

## 2023-03-20 DIAGNOSIS — Z3A09 9 weeks gestation of pregnancy: Secondary | ICD-10-CM

## 2023-03-20 DIAGNOSIS — Z87891 Personal history of nicotine dependence: Secondary | ICD-10-CM | POA: Diagnosis not present

## 2023-03-20 DIAGNOSIS — Z6841 Body Mass Index (BMI) 40.0 and over, adult: Secondary | ICD-10-CM | POA: Diagnosis not present

## 2023-03-20 DIAGNOSIS — Z3A10 10 weeks gestation of pregnancy: Secondary | ICD-10-CM

## 2023-03-20 DIAGNOSIS — Z01812 Encounter for preprocedural laboratory examination: Secondary | ICD-10-CM

## 2023-03-20 HISTORY — DX: Anxiety disorder, unspecified: F41.9

## 2023-03-20 HISTORY — PX: DILATION AND EVACUATION: SHX1459

## 2023-03-20 LAB — CBC
HCT: 34.3 % — ABNORMAL LOW (ref 36.0–46.0)
Hemoglobin: 12 g/dL (ref 12.0–15.0)
MCH: 30.6 pg (ref 26.0–34.0)
MCHC: 35 g/dL (ref 30.0–36.0)
MCV: 87.5 fL (ref 80.0–100.0)
Platelets: 304 10*3/uL (ref 150–400)
RBC: 3.92 MIL/uL (ref 3.87–5.11)
RDW: 13.7 % (ref 11.5–15.5)
WBC: 5.2 10*3/uL (ref 4.0–10.5)
nRBC: 0 % (ref 0.0–0.2)

## 2023-03-20 LAB — TYPE AND SCREEN
ABO/RH(D): O POS
Antibody Screen: NEGATIVE

## 2023-03-20 SURGERY — DILATION AND EVACUATION, UTERUS
Anesthesia: General | Site: Vagina

## 2023-03-20 MED ORDER — BUPIVACAINE HCL (PF) 0.25 % IJ SOLN
INTRAMUSCULAR | Status: AC
  Filled 2023-03-20: qty 30

## 2023-03-20 MED ORDER — ONDANSETRON HCL 4 MG/2ML IJ SOLN
INTRAMUSCULAR | Status: DC | PRN
  Administered 2023-03-20: 4 mg via INTRAVENOUS

## 2023-03-20 MED ORDER — DEXMEDETOMIDINE HCL IN NACL 80 MCG/20ML IV SOLN
INTRAVENOUS | Status: DC | PRN
  Administered 2023-03-20: 12 ug via INTRAVENOUS
  Administered 2023-03-20: 4 ug via INTRAVENOUS

## 2023-03-20 MED ORDER — DOXYCYCLINE HYCLATE 100 MG IV SOLR
200.0000 mg | INTRAVENOUS | Status: AC
  Administered 2023-03-20: 200 mg via INTRAVENOUS
  Filled 2023-03-20: qty 200

## 2023-03-20 MED ORDER — PROPOFOL 10 MG/ML IV BOLUS
INTRAVENOUS | Status: DC | PRN
  Administered 2023-03-20: 200 mg via INTRAVENOUS

## 2023-03-20 MED ORDER — KETOROLAC TROMETHAMINE 15 MG/ML IJ SOLN
INTRAMUSCULAR | Status: DC | PRN
  Administered 2023-03-20: 30 mg via INTRAVENOUS

## 2023-03-20 MED ORDER — IBUPROFEN 600 MG PO TABS: 600.0000 mg | ORAL_TABLET | Freq: Four times a day (QID) | ORAL | 3 refills | Status: DC | PRN

## 2023-03-20 MED ORDER — FENTANYL CITRATE (PF) 250 MCG/5ML IJ SOLN
INTRAMUSCULAR | Status: AC
  Filled 2023-03-20: qty 5

## 2023-03-20 MED ORDER — POVIDONE-IODINE 10 % EX SWAB
2.0000 | Freq: Once | CUTANEOUS | Status: AC
  Administered 2023-03-20: 2 via TOPICAL

## 2023-03-20 MED ORDER — MIDAZOLAM HCL 2 MG/2ML IJ SOLN
INTRAMUSCULAR | Status: DC | PRN
  Administered 2023-03-20: 2 mg via INTRAVENOUS

## 2023-03-20 MED ORDER — BUPIVACAINE-EPINEPHRINE (PF) 0.25% -1:200000 IJ SOLN
INTRAMUSCULAR | Status: AC
  Filled 2023-03-20: qty 30

## 2023-03-20 MED ORDER — DEXAMETHASONE SODIUM PHOSPHATE 10 MG/ML IJ SOLN
INTRAMUSCULAR | Status: DC | PRN
  Administered 2023-03-20: 5 mg via INTRAVENOUS

## 2023-03-20 MED ORDER — MISOPROSTOL 200 MCG PO TABS
ORAL_TABLET | ORAL | Status: AC
  Filled 2023-03-20: qty 1

## 2023-03-20 MED ORDER — LIDOCAINE 2% (20 MG/ML) 5 ML SYRINGE
INTRAMUSCULAR | Status: DC | PRN
  Administered 2023-03-20: 60 mg via INTRAVENOUS

## 2023-03-20 MED ORDER — ORAL CARE MOUTH RINSE: 15.0000 mL | Freq: Once | OROMUCOSAL | Status: AC

## 2023-03-20 MED ORDER — CHLORHEXIDINE GLUCONATE 0.12 % MT SOLN
15.0000 mL | Freq: Once | OROMUCOSAL | Status: AC
  Administered 2023-03-20: 15 mL via OROMUCOSAL
  Filled 2023-03-20: qty 15

## 2023-03-20 MED ORDER — ACETAMINOPHEN 10 MG/ML IV SOLN
INTRAVENOUS | Status: DC | PRN
  Administered 2023-03-20: 1000 mg via INTRAVENOUS

## 2023-03-20 MED ORDER — MIDAZOLAM HCL 2 MG/2ML IJ SOLN
INTRAMUSCULAR | Status: AC
  Filled 2023-03-20: qty 2

## 2023-03-20 MED ORDER — FENTANYL CITRATE (PF) 250 MCG/5ML IJ SOLN
INTRAMUSCULAR | Status: DC | PRN
  Administered 2023-03-20 (×5): 50 ug via INTRAVENOUS

## 2023-03-20 MED ORDER — BUPIVACAINE HCL (PF) 0.25 % IJ SOLN
INTRAMUSCULAR | Status: DC | PRN
  Administered 2023-03-20: 10 mL

## 2023-03-20 MED ORDER — KETOROLAC TROMETHAMINE 30 MG/ML IJ SOLN
INTRAMUSCULAR | Status: AC
  Filled 2023-03-20: qty 1

## 2023-03-20 MED ORDER — METHYLERGONOVINE MALEATE 0.2 MG/ML IJ SOLN
INTRAMUSCULAR | Status: AC
  Filled 2023-03-20: qty 1

## 2023-03-20 MED ORDER — OXYCODONE-ACETAMINOPHEN 5-325 MG PO TABS: 1.0000 | ORAL_TABLET | Freq: Four times a day (QID) | ORAL | 0 refills | Status: DC | PRN

## 2023-03-20 MED ORDER — GLYCOPYRROLATE PF 0.2 MG/ML IJ SOSY
PREFILLED_SYRINGE | INTRAMUSCULAR | Status: DC | PRN
  Administered 2023-03-20: .1 mg via INTRAVENOUS

## 2023-03-20 MED ORDER — LACTATED RINGERS IV SOLN: INTRAVENOUS | Status: DC

## 2023-03-20 MED ORDER — ACETAMINOPHEN 10 MG/ML IV SOLN
INTRAVENOUS | Status: AC
  Filled 2023-03-20: qty 100

## 2023-03-20 SURGICAL SUPPLY — 21 items
CATH ROBINSON RED A/P 16FR (CATHETERS) ×1 IMPLANT
FILTER UTR ASPR ASSEMBLY (MISCELLANEOUS) ×1 IMPLANT
GLOVE BIOGEL PI IND STRL 6.5 (GLOVE) ×1 IMPLANT
GLOVE BIOGEL PI IND STRL 7.0 (GLOVE) ×1 IMPLANT
GLOVE SURG SS PI 6.5 STRL IVOR (GLOVE) ×1 IMPLANT
GOWN STRL REUS W/ TWL LRG LVL3 (GOWN DISPOSABLE) ×2 IMPLANT
HOSE CONNECTING 18IN BERKELEY (TUBING) ×1 IMPLANT
KIT BERKELEY 1ST TRI 3/8 NO TR (MISCELLANEOUS) ×1 IMPLANT
KIT BERKELEY 1ST TRIMESTER 3/8 (MISCELLANEOUS) ×1 IMPLANT
NS IRRIG 1000ML POUR BTL (IV SOLUTION) ×1 IMPLANT
PACK VAGINAL MINOR WOMEN LF (CUSTOM PROCEDURE TRAY) ×1 IMPLANT
PAD OB MATERNITY 4.3X12.25 (PERSONAL CARE ITEMS) ×1 IMPLANT
SET BERKELEY SUCTION TUBING (SUCTIONS) ×1 IMPLANT
SPIKE FLUID TRANSFER (MISCELLANEOUS) ×1 IMPLANT
TOWEL GREEN STERILE FF (TOWEL DISPOSABLE) ×2 IMPLANT
UNDERPAD 30X36 HEAVY ABSORB (UNDERPADS AND DIAPERS) ×1 IMPLANT
VACURETTE 10 RIGID CVD (CANNULA) IMPLANT
VACURETTE 6 ASPIR F TIP BERK (CANNULA) IMPLANT
VACURETTE 7MM CVD STRL WRAP (CANNULA) IMPLANT
VACURETTE 8 RIGID CVD (CANNULA) IMPLANT
VACURETTE 9 RIGID CVD (CANNULA) IMPLANT

## 2023-03-20 NOTE — Transfer of Care (Signed)
 Immediate Anesthesia Transfer of Care Note  Patient: Emily Garrison  Procedure(s) Performed: DILATATION AND EVACUATION  Patient Location: PACU  Anesthesia Type:General  Level of Consciousness: awake  Airway & Oxygen Therapy: Patient Spontanous Breathing  Post-op Assessment: Report given to RN and Post -op Vital signs reviewed and stable  Post vital signs: Reviewed and stable  Last Vitals:  Vitals Value Taken Time  BP 109/56 03/20/23 1249  Temp    Pulse 69 03/20/23 1251  Resp 14 03/20/23 1251  SpO2 97 % 03/20/23 1251  Vitals shown include unfiled device data.  Last Pain:  Vitals:   03/20/23 0935  TempSrc:   PainSc: 0-No pain         Complications: There were no known notable events for this encounter.

## 2023-03-20 NOTE — Anesthesia Postprocedure Evaluation (Signed)
 Anesthesia Post Note  Patient: Ansel Ellen  Procedure(s) Performed: DILATATION AND EVACUATION (Vagina )     Patient location during evaluation: PACU Anesthesia Type: General Level of consciousness: awake and alert, oriented and patient cooperative Pain management: pain level controlled Vital Signs Assessment: post-procedure vital signs reviewed and stable Respiratory status: spontaneous breathing, nonlabored ventilation and respiratory function stable Cardiovascular status: blood pressure returned to baseline and stable Postop Assessment: no apparent nausea or vomiting Anesthetic complications: no   There were no known notable events for this encounter.  Last Vitals:  Vitals:   03/20/23 1330 03/20/23 1347  BP: 114/76 (!) 109/57  Pulse: (!) 53 61  Resp: 16 17  Temp:    SpO2: 94% 99%    Last Pain:  Vitals:   03/20/23 1250  TempSrc:   PainSc: 0-No pain                 Almarie CHRISTELLA Marchi

## 2023-03-20 NOTE — Op Note (Signed)
 Emily Garrison PROCEDURE DATE: 03/20/2023  PREOPERATIVE DIAGNOSIS: 10 week missed abortion. POSTOPERATIVE DIAGNOSIS: The same. PROCEDURE:     Dilation and Evacuation. SURGEON:  Dr. Winton Felt  INDICATIONS: 38 y.o. [id number] with MAB at [redacted] weeks gestation, needing surgical completion.  Risks of surgery were discussed with the patient including but not limited to: bleeding which may require transfusion; infection which may require antibiotics; injury to uterus or surrounding organs;need for additional procedures including laparotomy or laparoscopy; possibility of intrauterine scarring which may impair future fertility; and other postoperative/anesthesia complications. Written informed consent was obtained.    FINDINGS:  A 10-week size anteverted uterus, moderate amounts of products of conception, specimen sent to pathology.  ANESTHESIA:    Monitored intravenous sedation, paracervical block. INTRAVENOUS FLUIDS:  500 ml of LR ESTIMATED BLOOD LOSS:  Less than 20 ml. SPECIMENS:  Products of conception sent to pathology COMPLICATIONS:  None immediate.  PROCEDURE DETAILS:  The patient received intravenous antibiotics while in the preoperative area.  She was then taken to the operating room where general anesthesia was administered and was found to be adequate.  After an adequate timeout was performed, she was placed in the dorsal lithotomy position and examined; then prepped and draped in the sterile manner.   Her bladder was catheterized for an unmeasured amount of clear, yellow urine. A vaginal speculum was then placed in the patient's vagina and a single tooth tenaculum was applied to the anterior lip of the cervix.  A paracervical block using 0.5% Marcaine  was administered. The cervix was gently dilated to accommodate a 10 mm suction curette that was gently advanced to the uterine fundus.  The suction device was then activated and curette slowly rotated to clear the uterus of products of  conception.  A sharp curettage was then performed to confirm complete emptying of the uterus. There was minimal bleeding noted and the tenaculum removed with good hemostasis noted.   All instruments were removed from the patient's vagina. The patient tolerated the procedure well and was taken to the recovery area awake, and in stable condition. Specimen collected for Anora chromosomal studies  The patient will be discharged to home as per PACU criteria.  Routine postoperative instructions given.

## 2023-03-20 NOTE — Anesthesia Preprocedure Evaluation (Signed)
 Anesthesia Evaluation  Patient identified by MRN, date of birth, ID band Patient awake    Reviewed: Allergy & Precautions, H&P , NPO status , Patient's Chart, lab work & pertinent test results  Airway Mallampati: I  TM Distance: >3 FB Neck ROM: Full    Dental  (+) Teeth Intact, Dental Advisory Given   Pulmonary former smoker Snores at night   Pulmonary exam normal breath sounds clear to auscultation       Cardiovascular negative cardio ROS Normal cardiovascular exam Rhythm:Regular Rate:Normal     Neuro/Psych  PSYCHIATRIC DISORDERS Anxiety     negative neurological ROS     GI/Hepatic negative GI ROS, Neg liver ROS,,,  Endo/Other    Class 4 obesityBMI 55  Renal/GU negative Renal ROS  negative genitourinary   Musculoskeletal negative musculoskeletal ROS (+)    Abdominal  (+) + obese  Peds negative pediatric ROS (+)  Hematology negative hematology ROS (+)   Anesthesia Other Findings   Reproductive/Obstetrics Missed ab 9 weeks                             Anesthesia Physical Anesthesia Plan  ASA: 3  Anesthesia Plan: General   Post-op Pain Management: Tylenol  PO (pre-op)* and Toradol  IV (intra-op)*   Induction: Intravenous  PONV Risk Score and Plan: 3 and Ondansetron , Dexamethasone , Midazolam  and Treatment may vary due to age or medical condition  Airway Management Planned: LMA  Additional Equipment: None  Intra-op Plan:   Post-operative Plan: Extubation in OR  Informed Consent: I have reviewed the patients History and Physical, chart, labs and discussed the procedure including the risks, benefits and alternatives for the proposed anesthesia with the patient or authorized representative who has indicated his/her understanding and acceptance.     Dental advisory given  Plan Discussed with: CRNA  Anesthesia Plan Comments:        Anesthesia Quick Evaluation

## 2023-03-20 NOTE — Anesthesia Procedure Notes (Signed)
 Procedure Name: LMA Insertion Date/Time: 03/20/2023 12:16 PM  Performed by: Virgil Ee, CRNAPre-anesthesia Checklist: Patient identified, Patient being monitored, Timeout performed, Emergency Drugs available and Suction available Patient Re-evaluated:Patient Re-evaluated prior to induction Oxygen Delivery Method: Circle system utilized Preoxygenation: Pre-oxygenation with 100% oxygen Induction Type: IV induction Ventilation: Mask ventilation without difficulty LMA: LMA inserted LMA Size: 4.0 Number of attempts: 1 Placement Confirmation: positive ETCO2 and breath sounds checked- equal and bilateral Tube secured with: Tape Dental Injury: Teeth and Oropharynx as per pre-operative assessment

## 2023-03-21 ENCOUNTER — Encounter (HOSPITAL_COMMUNITY): Payer: Self-pay | Admitting: Obstetrics and Gynecology

## 2023-03-21 LAB — PANORAMA PRENATAL TEST FULL PANEL:PANORAMA TEST PLUS 5 ADDITIONAL MICRODELETIONS

## 2023-03-21 LAB — SURGICAL PATHOLOGY

## 2023-03-24 ENCOUNTER — Telehealth: Payer: Self-pay | Admitting: Family Medicine

## 2023-03-24 NOTE — Telephone Encounter (Signed)
 Patient called saying that she got an email over the weekend saying that her Linard [place]o test was cancelled and she would like to know who cancelled it and why because she still wants it done and does not want the samples that she gave to be discarded.

## 2023-03-25 LAB — HORIZON CUSTOM: REPORT SUMMARY: POSITIVE — AB

## 2023-03-27 ENCOUNTER — Encounter: Payer: Self-pay | Admitting: *Deleted

## 2023-03-28 LAB — ANORA MISCARRIAGE TEST - FRESH

## 2023-04-02 ENCOUNTER — Telehealth: Payer: Self-pay

## 2023-04-02 DIAGNOSIS — N96 Recurrent pregnancy loss: Secondary | ICD-10-CM

## 2023-04-02 NOTE — Telephone Encounter (Signed)
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 She can be referred if desired to speak with a genetic couselor

## 2023-04-10 ENCOUNTER — Other Ambulatory Visit: Payer: Self-pay

## 2023-04-10 ENCOUNTER — Ambulatory Visit: Payer: Medicaid Other | Admitting: Obstetrics and Gynecology

## 2023-04-10 ENCOUNTER — Other Ambulatory Visit (HOSPITAL_COMMUNITY)
Admission: RE | Admit: 2023-04-10 | Discharge: 2023-04-10 | Disposition: A | Discharge status: Home / Self Care | Payer: Medicaid Other | Source: Ambulatory Visit | Attending: Obstetrics and Gynecology | Admitting: Obstetrics and Gynecology

## 2023-04-10 ENCOUNTER — Encounter: Payer: Self-pay | Admitting: Obstetrics and Gynecology

## 2023-04-10 VITALS — BP 138/77 | HR 82 | Wt 330.0 lb

## 2023-04-10 DIAGNOSIS — Z09 Encounter for follow-up examination after completed treatment for conditions other than malignant neoplasm: Secondary | ICD-10-CM

## 2023-04-10 DIAGNOSIS — N898 Other specified noninflammatory disorders of vagina: Secondary | ICD-10-CM | POA: Insufficient documentation

## 2023-04-10 DIAGNOSIS — Z3A10 10 weeks gestation of pregnancy: Secondary | ICD-10-CM

## 2023-04-10 DIAGNOSIS — O021 Missed abortion: Secondary | ICD-10-CM

## 2023-04-10 NOTE — Progress Notes (Signed)
 GYNECOLOGY VISIT  Patient name: Emily Garrison MRN [id number]  Date of birth: 06/03/1985 Chief Complaint:   Follow-up  History:  Emily Garrison is a 38 y.o. [id number] being seen today for postop. Having just mild spotting, no pain. Engaged in unprotected intercourse 2 weeks postop. Denies fever, chills, chest pain, SOB, or abnormal vaginal discharge. Has primarily noted intermittent spotting.   Past Medical History:  Diagnosis Date   Anemia of pregnancy 03/12/2023   Anxiety    Breast cyst    Cyst of breast 03/12/2023   Herpes simplex 03/12/2023   Morbid obesity (HCC) 03/12/2023   PVC (premature ventricular contraction) 05/13/2021   Vaginal Pap smear, abnormal     Past Surgical History:  Procedure Laterality Date   CESAREAN SECTION     DILATION AND EVACUATION N/A 03/20/2023   Procedure: DILATATION AND EVACUATION;  Surgeon: Catalina Antigua, MD;  Location: [facility] OR;  Service: Gynecology;  Laterality: N/A;   removal of breast cyst      The following portions of the patient's history were reviewed and updated as appropriate: allergies, current medications, past family history, past medical history, past social history, past surgical history and problem list.   Health Maintenance:   Last pap reports pap in 2023 Last mammogram: N/A   Review of Systems:  Pertinent items are noted in HPI. Comprehensive review of systems was otherwise negative.   Objective:  Physical Exam BP 138/77   Pulse 82   Wt (!) 330 lb (149.7 kg)   LMP 01/03/2023   Breastfeeding Unknown   BMI 58.46 kg/m    Physical Exam Vitals and nursing note reviewed.  Constitutional:      Appearance: Normal appearance.  HENT:     Head: Normocephalic and atraumatic.  Pulmonary:     Effort: Pulmonary effort is normal.  Skin:    General: Skin is warm and dry.  Neurological:     General: No focal deficit present.     Mental Status: She is alert.  Psychiatric:        Mood and Affect: Mood normal.         Behavior: Behavior normal.        Thought Content: Thought content normal.        Judgment: Judgment normal.   Emily Garrison: Abnormal Female  Microarray Garrison: arr(20)x3  Clinical Interpretation: Abnormal Garrison. Trisomy 20 detected. Trisomy 20 is generally incompatible with fetal survival. Overall, trisomy is found in approximately 45% of miscarriages. Genetic counseling is recommended to discuss the significance of this Garrison. Referral to a local genetic counselor may be considered.     Assessment & Plan:    1. Postop check (Primary) Doing well, no concerns.   2. 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  Reassured patient that miscarriage is common with ~1/4 of women experiencing it in their lifetime. Reassured patient that there is nothing she did or did not do to cause this. Reviewed most common reason is presumed to be genetic abnormalities which was confirmed on Emily testing of POC. Reviewed that studies show no definite difference between attempting another pregnancy sooner vs waiting, though some studies do show better live birth outcomes with trying sooner.  Discussed that she should wait for one normal cycle and then they may begin trying again if she feels emotionally ready as well.  Answered all questions.   3. Vaginal discharge Self swab vainitis swab collected - Cervicovaginal ancillary only  Routine preventative health maintenance  measures emphasized.  Lorriane Shire, MD [facility] for [facility], [facility]

## 2023-04-13 LAB — CERVICOVAGINAL ANCILLARY ONLY
Bacterial Vaginitis (gardnerella): NEGATIVE
Candida Glabrata: NEGATIVE
Candida Vaginitis: NEGATIVE
Chlamydia: NEGATIVE
Comment: NEGATIVE
Comment: NEGATIVE
Comment: NEGATIVE
Comment: NEGATIVE
Comment: NEGATIVE
Comment: NORMAL
Neisseria Gonorrhea: NEGATIVE
Trichomonas: NEGATIVE

## 2023-04-14 ENCOUNTER — Encounter: Payer: Commercial Managed Care - HMO | Admitting: Obstetrics and Gynecology

## 2023-04-14 ENCOUNTER — Ambulatory Visit: Payer: Medicaid Other | Attending: Obstetrics and Gynecology

## 2023-04-14 ENCOUNTER — Encounter: Payer: Self-pay | Admitting: Obstetrics and Gynecology

## 2023-04-14 DIAGNOSIS — Z3169 Encounter for other general counseling and advice on procreation: Secondary | ICD-10-CM | POA: Diagnosis not present

## 2023-04-14 DIAGNOSIS — O285 Abnormal chromosomal and genetic finding on antenatal screening of mother: Secondary | ICD-10-CM

## 2023-04-14 DIAGNOSIS — D573 Sickle-cell trait: Secondary | ICD-10-CM

## 2023-04-14 NOTE — Progress Notes (Signed)
 [facility] for [facility]etal [facility] at [facility] [address], [address] Phone:  [phone]   Fax:  [phone]      In-Person Genetic Counseling Clinic Note:   I spoke with 38 y.o. Emily Garrison today to discuss genetics of her recent pregnancy loss and her carrier screening results. She was referred by Catalina Antigua, MD.   Pregnancy History:    [id number]. Emily Garrison is not currently pregnant. She had one elective termination and a recent miscarriage. Anora products of conception testing was performed on the recent miscarriage, and the results returned as arr(20)x3, indicating trisomy 20 that was noted by the lab to be of maternal origin. We reviewed that full trisomy 20 is not expected to be compatible with life and is mostly due to random nondisjunction events during the formation of the reproductive cells. Since the lab reports that the extra chromosome 20 is of maternal origin, we expect that her egg cell had a nondisjunction event that resulted in two instead of one chromosome 20. We reviewed that this is unlikely to be inherited and the recurrence risk of another chromosome difference in a future pregnancy is ~1%. We also reviewed the age-related risks of chromosome differences in a pregnancy and that they increase with age, with Laressa age-related risk to be 1 in 149 (~0.7%). Emily Garrison and her husband are planning on another pregnancy.  Denies personal health concerns.   Family History:    A three-generation pedigree was created and scanned into Epic under the Media tab.  Emily Garrison reports that she, her daughter, her maternal half-sister and her three children all have chronic nosebleeds. They are being followed for the nosebleeds. We discussed that there are genetic conditions that can be associated with nosebleeds. Moreover, this could be an isolated, multifactorial finding. Emily Garrison may be referred to adult genetics if her provider is suspicious of  additional symptoms that can be associated with a genetic condition. In the absence of known medical reports or genetic testing, recurrence risk is difficult to estimate; however, the nosebleeds seem to follow a dominant pattern of inheritance in the family and there may be an up to 50% chance of a future pregnancy being affected.  Emily Garrison's paternal-half sister has a daughter who was born with "one kidney not working properly." She is otherwise healthy. We reviewed that this may be isolated or part of a genetic condition. Without known medical reports, recurrence risk is difficult to estimate.  Emily Garrison partner Emily Garrison reports that his first cousin once removed is affected with a progressive muscular dystrophy. She does not have intellectual disability. Her two sisters are unaffected. We reviewed that there are many types of muscular dystrophy that can be inherited in autosomal dominant, autosomal recessive, or X-linked patterns. The couple is encouraged to reach out with a known diagnosis and/or medical reports to further assess risk, as risk assessment is currently limited.  Emily Garrison reports that his paternal uncle's 36 yo son has autism dx. 54 yo. We discussed that genetic testing for individuals with a clinical diagnosis of autism yields an explanation in only about 20% of cases, and the remaining 80% of cases are left with unknown etiology. Without genetic testing performed on affected family members, it is difficult to assess risk to the pregnancy and other family members. The risk may be up to 50% in the case of an identified genetic cause. We reviewed that fragile X syndrome carrier screening is not indicated per the inheritance of autism in this  family.  This is based on the reported family history. If the couple learns of additional or different information from what was reported, they are encouraged to call to reassess risk to a future pregnancy, as various conditions may have similar  symptoms.   Maternal ethnicity reported as Black and paternal ethnicity reported as Black. Denies Ashkenazi Jewish ancestry.  Family history not remarkable for consanguinity, individuals with birth defects, intellectual disability, multiple spontaneous abortions, still births, or unexplained neonatal death.   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 Therefore, she makes both the typical hemoglobin A and the atypical hemoglobin S (HbA/S). This is typically not associated with symptoms in the carrier. Please see report for details.  We reviewed beta globin genes, hemoglobin, forms of beta-hemoglobinopathies and their natural histories, and the autosomal mode of inheritance. Benay will either pass down either the normal beta globin gene that produces the normal hemoglobin A or the altered beta globin gene that produces hemoglobin S. There would be a 25% risk for each future pregnancy to be affected with sickle cell disease (HbS/S) if Melea reproductive partner is also a carrier. There are several types of beta-hemoglobinopathies, including hemoglobin C, hemoglobin O, hemoglobin E, and beta-thalassemia. If Ireanna reproductive partner is found to be a carrier for for a beta-hemoglobinopathy, there would be a 25% chance that this pregnancy would be affected. The type of beta-hemoglobinopathy and symptoms will vary depending on the genotype.   Given these results, we discussed and offered carrier screening for Brier's reproductive partner. We reviewed the benefits and limitations of carrier screening and that it can detect most but not all carriers.  The couple consented carrier screening for [place]. A saliva kit was provided to Amelia. We reviewed that if both were found to be carriers, different options are available including IVF with PGT-M for sickle cell  disease, prenatal diagnostic testing, [facility] single gene fetal risk assessment, and postnatal genetic testing as well as [place]'s Newborn Screening program.  In the meantime, we calculated the risk for the current pregnancy to be affected with a beta-hemoglobinopathy. Based on Vashti's carrier screening results and her partner's ethnicity, the current pregnancy is at a 1 in 32 (~3%) risk to be affected.  Of note, Taunya was not found to be a carrier for the other three conditions screened for (alpha thalassemia, CF, and SMA). This significantly reduces but does not eliminate the chance of being a carrier. Please see report for details.    Plan of Care:   Horizon saliva kit was provided to [facility] for FOB carrier screening for sickle cell disease.   Informed consent was obtained. All questions were answered.   95 minutes were spent on the date of the encounter in service to the patient including preparation, face-to-face consultation, discussion of test reports and available next steps, pedigree construction, genetic risk assessment, documentation, and care coordination.    Thank you for sharing in the care of Shaunie with [place].  Please do not hesitate to contact us at [phone] if you have any questions.   Sheppard Plumber, MS, [facility] Certified Genetic Counselor   Genetic counseling student involved in appointment: No.

## 2023-04-30 ENCOUNTER — Ambulatory Visit: Payer: Self-pay

## 2023-05-05 ENCOUNTER — Telehealth: Payer: Self-pay

## 2023-05-05 NOTE — Telephone Encounter (Signed)
 I called the patient to discuss her partner's carrier screening results Emily Garrison DOB 07/16/1986). He was not found to be a carrier for beta-hemoglobinopathies. Please see report for details. A negative result on carrier screening reduces but does not eliminate the chance of being a carrier. The chance this couple's current and future pregnancies would be affected with a beta-hemoglobinopathy is very low.  Sheppard Plumber, MS, [facility] Certified Genetic Counselor [facility] for [facility] [phone]

## 2023-07-29 ENCOUNTER — Ambulatory Visit (INDEPENDENT_AMBULATORY_CARE_PROVIDER_SITE_OTHER)

## 2023-07-29 DIAGNOSIS — Z3201 Encounter for pregnancy test, result positive: Secondary | ICD-10-CM | POA: Diagnosis not present

## 2023-07-29 DIAGNOSIS — Z3490 Encounter for supervision of normal pregnancy, unspecified, unspecified trimester: Secondary | ICD-10-CM

## 2023-07-29 LAB — POCT PREGNANCY, URINE: Preg Test, Ur: POSITIVE — AB

## 2023-07-29 MED ORDER — PRENATAL 27-0.8 MG PO TABS
1.0000 | ORAL_TABLET | Freq: Every day | ORAL | 11 refills | Status: DC
Start: 2023-07-29 — End: 2023-08-07

## 2023-07-29 NOTE — Patient Instructions (Signed)

## 2023-07-29 NOTE — Progress Notes (Signed)
 Possible Pregnancy  Here today for pregnancy confirmation. UPT in office today is positive. Spoke with pt via telephone to discuss results.  Pt reports first positive home UPT on 07/23/23 . Reviewed dating with patient:   LMP: 06/25/23 EDD: 03/31/24 4w 6d today  OB history reviewed. Reviewed medications and allergies with patient; list of medications safe to take during pregnancy given.  Recommended pt begin prenatal vitamin and schedule prenatal care.  PNV sent to pt pharmacy, she would like to receive [facility].  Advised pt to precautions for abdominal pain or vaginal bleeding to be assesed at [facility] at [facility].  Pt verbalized understanding with no further questions.     Ninette Basque, RN 07/29/2023  1:42 PM

## 2023-08-02 ENCOUNTER — Encounter: Payer: Self-pay | Admitting: Obstetrics and Gynecology

## 2023-08-04 ENCOUNTER — Inpatient Hospital Stay (HOSPITAL_COMMUNITY)
Admission: AD | Admit: 2023-08-04 | Discharge: 2023-08-04 | Disposition: A | Discharge status: Home / Self Care | Attending: Obstetrics & Gynecology | Admitting: Obstetrics & Gynecology

## 2023-08-04 ENCOUNTER — Inpatient Hospital Stay (HOSPITAL_COMMUNITY)

## 2023-08-04 ENCOUNTER — Encounter (HOSPITAL_COMMUNITY): Payer: Self-pay | Admitting: *Deleted

## 2023-08-04 DIAGNOSIS — O3680X0 Pregnancy with inconclusive fetal viability, not applicable or unspecified: Secondary | ICD-10-CM | POA: Diagnosis not present

## 2023-08-04 DIAGNOSIS — Z3A01 Less than 8 weeks gestation of pregnancy: Secondary | ICD-10-CM | POA: Insufficient documentation

## 2023-08-04 DIAGNOSIS — Z3A00 Weeks of gestation of pregnancy not specified: Secondary | ICD-10-CM | POA: Diagnosis not present

## 2023-08-04 DIAGNOSIS — O209 Hemorrhage in early pregnancy, unspecified: Secondary | ICD-10-CM | POA: Insufficient documentation

## 2023-08-04 DIAGNOSIS — R7989 Other specified abnormal findings of blood chemistry: Secondary | ICD-10-CM | POA: Diagnosis not present

## 2023-08-04 HISTORY — DX: Dermatitis, unspecified: L30.9

## 2023-08-04 LAB — URINALYSIS, ROUTINE W REFLEX MICROSCOPIC
Bilirubin Urine: NEGATIVE
Glucose, UA: NEGATIVE mg/dL
Ketones, ur: 20 mg/dL — AB
Leukocytes,Ua: NEGATIVE
Nitrite: NEGATIVE
Protein, ur: NEGATIVE mg/dL
Specific Gravity, Urine: 1.014 (ref 1.005–1.030)
pH: 5 (ref 5.0–8.0)

## 2023-08-04 LAB — CBC
HCT: 36 % (ref 36.0–46.0)
Hemoglobin: 12.3 g/dL (ref 12.0–15.0)
MCH: 30.1 pg (ref 26.0–34.0)
MCHC: 34.2 g/dL (ref 30.0–36.0)
MCV: 88.2 fL (ref 80.0–100.0)
Platelets: 291 10*3/uL (ref 150–400)
RBC: 4.08 MIL/uL (ref 3.87–5.11)
RDW: 14.5 % (ref 11.5–15.5)
WBC: 4.8 10*3/uL (ref 4.0–10.5)
nRBC: 0 % (ref 0.0–0.2)

## 2023-08-04 LAB — WET PREP, GENITAL
Clue Cells Wet Prep HPF POC: NONE SEEN
Sperm: NONE SEEN
Trich, Wet Prep: NONE SEEN
WBC, Wet Prep HPF POC: <10 (ref <10)
Yeast Wet Prep HPF POC: NONE SEEN

## 2023-08-04 LAB — HCG, QUANTITATIVE, PREGNANCY: hCG, Beta Chain, Quant, S: 144 m[IU]/mL — ABNORMAL HIGH (ref <5)

## 2023-08-04 NOTE — MAU Provider Note (Signed)
 [name] is a 38 y.o. [phone] patient who presents to MAU today with complaint of starting to have some spotting on Friday . She reports brownish and only when she wipes. Denies any heavy vaginal bleeding or abdominal cramping. She reports her +UPT was ~2 weeks ago and she recently had a miscarriage in 03/2023 therefore she is concerned.   O BP 132/61 (BP Location: Right Arm)   Pulse 71   Temp 98.4 F (36.9 C) (Oral)   Resp 16   Ht 5' 4 (1.626 m)   Wt (!) 149.7 kg   LMP 06/25/2023 (Exact Date)   SpO2 98%   BMI 56.64 kg/m  Physical Exam Vitals and nursing note reviewed.  Constitutional:      General: She is not in acute distress.    Appearance: Normal appearance. She is obese. She is not ill-appearing.  HENT:     Head: Normocephalic.     Nose: Nose normal.     Mouth/Throat:     Mouth: Mucous membranes are moist.   Cardiovascular:     Rate and Rhythm: Normal rate.  Pulmonary:     Effort: Pulmonary effort is normal.  Abdominal:     Palpations: Abdomen is soft.   Musculoskeletal:        General: Normal range of motion.     Cervical back: Normal range of motion.   Skin:    General: Skin is warm.   Neurological:     Mental Status: She is alert and oriented to person, place, and time.   Psychiatric:        Mood and Affect: Mood is anxious.        Behavior: Behavior normal.     MDM  HIGH  Vaginal bleeding in early pregnacy CBC: unremarkable HCG Quant: 144  ( Hcg Levels need to be repeated in 48 hours)  ABO:  O Positive OB Ultrasound: Unable to confirm an IUP  Vaginal Swabs : Negative UA: hematuria likely related to vaginal bleeding   Differential diagnosis considered for 1st trimester vaginal bleeding includes but is not limited to: ectopic pregnancy, complete spontaneous abortion, incomplete abortion, missed abortion, threatened abortion, embryonic/fetal demise, cervical insufficiency, cervical or vaginal disorder    Orders Placed  This Encounter  Procedures   Wet prep, genital    Standing Status:   Standing    Number of Occurrences:   1   US  OB LESS THAN 14 WEEKS WITH OB TRANSVAGINAL    Standing Status:   Standing    Number of Occurrences:   1    Symptom/Reason for Exam:   Vaginal bleeding [[id number]]   CBC    Standing Status:   Standing    Number of Occurrences:   1   hCG, quantitative, pregnancy    Standing Status:   Standing    Number of Occurrences:   1   Urinalysis, Routine w reflex microscopic -Urine, Random    Standing Status:   Standing    Number of Occurrences:   1    Specimen Source:   Urine, Random [[id number]]   Discharge patient Discharge disposition: 01-Home or Self Care; Discharge patient date: 08/04/2023    Standing Status:   Standing    Number of Occurrences:   1    Discharge disposition:   01-Home or Self Care [1]    Discharge patient date:   08/04/2023      Results for orders placed or performed during the hospital encounter of 08/04/23 (from  the past 24 hours)  CBC     Status: None   Collection Time: 08/04/23  3:23 PM  Result Value Ref Range   WBC 4.8 4.0 - 10.5 K/uL   RBC 4.08 3.87 - 5.11 MIL/uL   Hemoglobin 12.3 12.0 - 15.0 g/dL   HCT 63.9 63.9 - 53.9 %   MCV 88.2 80.0 - 100.0 fL   MCH 30.1 26.0 - 34.0 pg   MCHC 34.2 30.0 - 36.0 g/dL   RDW 85.4 88.4 - 84.4 %   Platelets 291 150 - 400 K/uL   nRBC 0.0 0.0 - 0.2 %  hCG, quantitative, pregnancy     Status: Abnormal   Collection Time: 08/04/23  3:23 PM  Result Value Ref Range   hCG, Beta Chain, Quant, S 144 (H) <5 mIU/mL  Wet prep, genital     Status: None   Collection Time: 08/04/23  3:26 PM  Result Value Ref Range   Yeast Wet Prep HPF POC NONE SEEN NONE SEEN   Trich, Wet Prep NONE SEEN NONE SEEN   Clue Cells Wet Prep HPF POC NONE SEEN NONE SEEN   WBC, Wet Prep HPF POC <10 <10   Sperm NONE SEEN   Urinalysis, Routine w reflex microscopic -Urine, Random     Status: Abnormal   Collection Time: 08/04/23  3:30 PM  Result Value Ref Range    Color, Urine YELLOW YELLOW   APPearance HAZY (A) CLEAR   Specific Gravity, Urine 1.014 1.005 - 1.030   pH 5.0 5.0 - 8.0   Glucose, UA NEGATIVE NEGATIVE mg/dL   Hgb urine dipstick LARGE (A) NEGATIVE   Bilirubin Urine NEGATIVE NEGATIVE   Ketones, ur 20 (A) NEGATIVE mg/dL   Protein, ur NEGATIVE NEGATIVE mg/dL   Nitrite NEGATIVE NEGATIVE   Leukocytes,Ua NEGATIVE NEGATIVE   RBC / HPF 0-5 0 - 5 RBC/hpf   WBC, UA 0-5 0 - 5 WBC/hpf   Bacteria, UA RARE (A) NONE SEEN   Squamous Epithelial / HPF 0-5 0 - 5 /HPF    Study Result  Narrative & Impression  CLINICAL DATA:  Vaginal bleeding for few days.   EXAM: OBSTETRIC <14 WK US  AND TRANSVAGINAL OB US    TECHNIQUE: Both transabdominal and transvaginal ultrasound examinations were performed for complete evaluation of the gestation as well as the maternal uterus, adnexal regions, and pelvic cul-de-sac. Transvaginal technique was performed to assess early pregnancy.   COMPARISON:  March 17, 2023   FINDINGS: Intrauterine gestational sac: None   Yolk sac:  Not Visualized.   Embryo:  Not Visualized.   Cardiac Activity: Not Visualized.   Heart Rate: N/A  bpm   Maternal uterus/adnexae: The endometrium measures 12 mm in thickness.   The right ovary measures 3.4 cm x 2.0 cm x 2.6 cm and is normal in appearance.   The left ovary measures 2.8 cm x 2.9 cm x 3.2 cm and is normal in appearance.   No pelvic free fluid is identified.   IMPRESSION: No evidence of an intrauterine pregnancy. Correlation with follow-up pelvic ultrasound and serial beta HCG levels is recommended if this remains of clinical concern.     Electronically Signed   By: Suzen Dials M.D.   On: 08/04/2023 16:42      I have reviewed the patient chart and performed the physical exam . I have ordered & interpreted the lab results and reviewed and interpreted the Ultrasound images and agree with the radiologist findings.  A/P as described below.  Counseling  and education provided and patient agreeable  with plan as described below. Verbalized understanding.    ASSESSMENT  1. Vaginal bleeding affecting early pregnancy (Primary)  2. Elevated serum human chorionic gonadotropin  (hCG) level  3. Pregnancy of unknown anatomic location     PLAN Future Appointments  Date Time Provider Department Center  08/06/2023 11:00 AM [facility] [place]  08/27/2023  8:15 AM [facility] US2 [facility]  09/10/2023  8:15 AM [facility] INTAKE [facility]    Discharge from MAU in stable condition  Repeat HCG levels in 48 hours and trend numbers for clinical diagnosis   See AVS for full description of educational information and instructions provided to the patient at time of discharge  Warning signs for worsening condition that would warrant emergency follow-up discussed Patient may return to MAU as needed   Littie Olam LABOR, NP 08/04/2023 5:13 PM

## 2023-08-04 NOTE — MAU Note (Signed)
 Emily Garrison Emily Garrison is a 38 y.o. at [redacted]w[redacted]d here in MAU reporting: started spotting on Friday, brown, only when she wipes.  Turned red yesterday, still only when she wipes. Few little tiny thready clots.  No pain.  Never had anything like this.  Called dr and was told to come.  Denies recent intercourse. LMP: 5/14 Onset of complaint: Friday night Pain score: none Vitals:   08/04/23 1506  BP: 132/61  Pulse: 71  Resp: 16  Temp: 98.4 F (36.9 C)  SpO2: 98%     Lab orders placed from triage:     Had a D&C back in Feb for a miscarriage.

## 2023-08-04 NOTE — Discharge Instructions (Signed)
 Repeat Blood work on 08/06/23

## 2023-08-05 LAB — GC/CHLAMYDIA PROBE AMP (~~LOC~~) NOT AT ARMC
Chlamydia: NEGATIVE
Comment: NEGATIVE
Comment: NORMAL
Neisseria Gonorrhea: NEGATIVE

## 2023-08-06 ENCOUNTER — Other Ambulatory Visit (INDEPENDENT_AMBULATORY_CARE_PROVIDER_SITE_OTHER): Payer: Self-pay

## 2023-08-06 ENCOUNTER — Other Ambulatory Visit: Payer: Self-pay

## 2023-08-06 VITALS — BP 130/87 | HR 72 | Ht 64.0 in | Wt 331.0 lb

## 2023-08-06 DIAGNOSIS — O209 Hemorrhage in early pregnancy, unspecified: Secondary | ICD-10-CM

## 2023-08-06 DIAGNOSIS — Z3A00 Weeks of gestation of pregnancy not specified: Secondary | ICD-10-CM

## 2023-08-06 LAB — BETA HCG QUANT (REF LAB): hCG Quant: 157 m[IU]/mL

## 2023-08-06 NOTE — Progress Notes (Signed)
 Beta HCG Follow-up Visit  Emily Garrison presents to [facility] for follow-up beta HCG lab. She was seen in MAU for vaginal spotting on 08/04/23. Patient reports light spotting only when wiping sometimes. Discussed with patient that we are following beta HCG levels today. Reviewed MAU precautions with patient. Valid contact number for patient confirmed. I will call the patient with results.   Beta HCG results: 08/04/23 144  08/06/23 157   Results and patient history reviewed with Dr. Izell, who states levels only showed slight changes. Advised patient to come back for another stat HCG tomorrow morning and MD visit tomorrow afternoon.  1616 Patient called and informed of plan for follow-up. Patient expressed frustration regarding not knowing definitive results. Provided patient with condolences as HCG levels did not provided any definitive answers. Patient scheduled for stat lab HCG tomorrow 08/07/23 at 0900 and MD visit 08/06/23 at 2:15 PM. Patient confirmed scheduled appointment. Patient reports less spotting when wiping. Reviewed MAU precautions with patient.      [name] 08/06/2023 11:01 AM

## 2023-08-07 ENCOUNTER — Ambulatory Visit: Admitting: Obstetrics & Gynecology

## 2023-08-07 ENCOUNTER — Other Ambulatory Visit

## 2023-08-07 ENCOUNTER — Inpatient Hospital Stay (HOSPITAL_COMMUNITY)
Admission: AD | Admit: 2023-08-07 | Discharge: 2023-08-07 | Disposition: A | Discharge status: Home / Self Care | Payer: Self-pay | Attending: Obstetrics and Gynecology | Admitting: Obstetrics and Gynecology

## 2023-08-07 VITALS — BP 119/75 | HR 73 | Wt 333.2 lb

## 2023-08-07 DIAGNOSIS — O009 Unspecified ectopic pregnancy without intrauterine pregnancy: Secondary | ICD-10-CM

## 2023-08-07 DIAGNOSIS — O209 Hemorrhage in early pregnancy, unspecified: Secondary | ICD-10-CM

## 2023-08-07 DIAGNOSIS — Z3A00 Weeks of gestation of pregnancy not specified: Secondary | ICD-10-CM | POA: Diagnosis not present

## 2023-08-07 DIAGNOSIS — Z79631 Long term (current) use of antimetabolite agent: Secondary | ICD-10-CM | POA: Insufficient documentation

## 2023-08-07 DIAGNOSIS — O3680X0 Pregnancy with inconclusive fetal viability, not applicable or unspecified: Secondary | ICD-10-CM

## 2023-08-07 DIAGNOSIS — Z3A01 Less than 8 weeks gestation of pregnancy: Secondary | ICD-10-CM

## 2023-08-07 LAB — COMPREHENSIVE METABOLIC PANEL WITH GFR
ALT: 12 U/L (ref 0–44)
AST: 16 U/L (ref 15–41)
Albumin: 3.7 g/dL (ref 3.5–5.0)
Alkaline Phosphatase: 47 U/L (ref 38–126)
Anion gap: 10 (ref 5–15)
BUN: 10 mg/dL (ref 6–20)
CO2: 23 mmol/L (ref 22–32)
Calcium: 8.9 mg/dL (ref 8.9–10.3)
Chloride: 104 mmol/L (ref 98–111)
Creatinine, Ser: 0.83 mg/dL (ref 0.44–1.00)
GFR, Estimated: >60 mL/min (ref >60)
Glucose, Bld: 81 mg/dL (ref 70–99)
Potassium: 3.6 mmol/L (ref 3.5–5.1)
Sodium: 137 mmol/L (ref 135–145)
Total Bilirubin: 0.6 mg/dL (ref 0.0–1.2)
Total Protein: 6.6 g/dL (ref 6.5–8.1)

## 2023-08-07 LAB — CBC
HCT: 32.3 % — ABNORMAL LOW (ref 36.0–46.0)
Hemoglobin: 11.2 g/dL — ABNORMAL LOW (ref 12.0–15.0)
MCH: 30.5 pg (ref 26.0–34.0)
MCHC: 34.7 g/dL (ref 30.0–36.0)
MCV: 88 fL (ref 80.0–100.0)
Platelets: 243 10*3/uL (ref 150–400)
RBC: 3.67 MIL/uL — ABNORMAL LOW (ref 3.87–5.11)
RDW: 14.4 % (ref 11.5–15.5)
WBC: 4.8 10*3/uL (ref 4.0–10.5)
nRBC: 0 % (ref 0.0–0.2)

## 2023-08-07 LAB — BETA HCG QUANT (REF LAB): hCG Quant: 181 m[IU]/mL

## 2023-08-07 MED ORDER — METHOTREXATE FOR ECTOPIC PREGNANCY
50.0000 mg/m2 | Freq: Once | INTRAMUSCULAR | Status: AC
  Administered 2023-08-07: 130 mg via INTRAMUSCULAR
  Filled 2023-08-07: qty 5.2

## 2023-08-07 NOTE — MAU Provider Note (Signed)
 History     CSN: [id number]  Arrival date and time: 08/07/23 2045   Event Date/Time   First Provider Initiated Contact with Patient 08/07/23 2256      Chief Complaint  Patient presents with   Follow-up   Emily Garrison Legend Pecore is a 38 y.o. [id number] with ectopic pregnancy.  She presents today for Methotrexate. Patient denies issues.   OB History     Gravida  4   Para  1   Term  1   Preterm  0   AB  2   Living  1      SAB  1   IAB  1   Ectopic  0   Multiple  0   Live Births  1        Obstetric Comments  2010 procedure 2012  c/s FTP         Past Medical History:  Diagnosis Date   Anemia of pregnancy 03/12/2023   Anxiety    Breast cyst    Cyst of breast 03/12/2023   Eczema    Herpes simplex 03/12/2023   Morbid obesity (HCC) 03/12/2023   PVC (premature ventricular contraction) 05/13/2021   Vaginal Pap smear, abnormal     Past Surgical History:  Procedure Laterality Date   CESAREAN SECTION     DILATION AND EVACUATION N/A 03/20/2023   Procedure: DILATATION AND EVACUATION;  Surgeon: Alger Gong, MD;  Location: [facility] OR;  Service: Gynecology;  Laterality: N/A;   removal of breast cyst Left    THERAPEUTIC ABORTION  2010    Family History  Problem Relation Age of Onset   Hypertension Mother    Asthma Mother    Diabetes Father    Stroke Paternal Aunt    Dementia Maternal Grandmother    Stroke Paternal Grandmother     Social History   Tobacco Use   Smoking status: Former    Current packs/day: 0.00    Average packs/day: 0.3 packs/day for 10.0 years (2.5 ttl pk-yrs)    Types: Cigarettes    Start date: 09/2010    Quit date: 09/2020    Years since quitting: 2.9   Smokeless tobacco: Never  Vaping Use   Vaping status: Never Used  Substance Use Topics   Alcohol use: Not Currently    Comment: occasionally   Drug use: No    Allergies:  Allergies  Allergen Reactions   Latex Hives and Other (See Comments)    Medications  Prior to Admission  Medication Sig Dispense Refill Last Dose/Taking   Prenatal Vit-Fe Fumarate-FA (MULTIVITAMIN-PRENATAL) 27-0.8 MG TABS tablet Take 1 tablet by mouth daily at 12 noon. 30 tablet 11     Review of Systems  Gastrointestinal:  Negative for abdominal pain.  Genitourinary:  Negative for vaginal bleeding.   Physical Exam   Blood pressure 119/71, pulse 71, temperature 98.7 F (37.1 C), temperature source Oral, resp. rate 16, height 5' 4 (1.626 m), weight (!) 152 kg, last menstrual period 06/25/2023, SpO2 100%.  Physical Exam Vitals and nursing note reviewed.  Constitutional:      Appearance: Normal appearance. She is obese.  HENT:     Head: Normocephalic and atraumatic.   Eyes:     Conjunctiva/sclera: Conjunctivae normal.    Cardiovascular:     Rate and Rhythm: Normal rate.  Pulmonary:     Effort: Pulmonary effort is normal. No respiratory distress.   Musculoskeletal:        General: Normal range of motion.  Cervical back: Normal range of motion.   Neurological:     Mental Status: She is alert and oriented to person, place, and time.   Psychiatric:        Mood and Affect: Mood normal.        Behavior: Behavior normal.     MAU Course  Procedures Results for orders placed or performed during the hospital encounter of 08/07/23 (from the past 24 hours)  CBC     Status: Abnormal   Collection Time: 08/07/23  9:44 PM  Result Value Ref Range   WBC 4.8 4.0 - 10.5 K/uL   RBC 3.67 (L) 3.87 - 5.11 MIL/uL   Hemoglobin 11.2 (L) 12.0 - 15.0 g/dL   HCT 67.6 (L) 63.9 - 53.9 %   MCV 88.0 80.0 - 100.0 fL   MCH 30.5 26.0 - 34.0 pg   MCHC 34.7 30.0 - 36.0 g/dL   RDW 85.5 88.4 - 84.4 %   Platelets 243 150 - 400 K/uL   nRBC 0.0 0.0 - 0.2 %  Comprehensive metabolic panel     Status: None   Collection Time: 08/07/23  9:44 PM  Result Value Ref Range   Sodium 137 135 - 145 mmol/L   Potassium 3.6 3.5 - 5.1 mmol/L   Chloride 104 98 - 111 mmol/L   CO2 23 22 - 32 mmol/L    Glucose, Bld 81 70 - 99 mg/dL   BUN 10 6 - 20 mg/dL   Creatinine, Ser 9.16 0.44 - 1.00 mg/dL   Calcium 8.9 8.9 - 89.6 mg/dL   Total Protein 6.6 6.5 - 8.1 g/dL   Albumin 3.7 3.5 - 5.0 g/dL   AST 16 15 - 41 U/L   ALT 12 0 - 44 U/L   Alkaline Phosphatase 47 38 - 126 U/L   Total Bilirubin 0.6 0.0 - 1.2 mg/dL   GFR, Estimated >39 >39 mL/min   Anion gap 10 5 - 15    MDM CMP/CBC Methotrexate Coordination of Follow Up Assessment and Plan  38 year old female Ectopic Pregnancy   -The risks of methotrexate were reviewed including failure requiring repeat dosing or eventual surgery.  -She understands that methotrexate involves frequent return visits to monitor lab values and that she remains at risk of ectopic rupture until her beta is less than assay. ? -The patient opts to proceed with methotrexate.   -She has normal BUN/Cr/LFT's/platelets.   -Side effects of photosensitivity & GI upset were discussed.   -She was encouraged to avoid direct sunlight and abstain from alcohol, and sexual intercourse for two weeks.  -She was counseled to discontinue any MVI with folic acid. ?-She understands to follow up on D4 (Sunday) and D7 (Wednesday) for repeat BHCG and was given the instruction sheet. ?-Strict ectopic precautions were reviewed, the patient knows to call with any abdominal pain, vomiting, fainting, or any concerns with her health.   Day 0/1 Day 4 Day 7  Sunday Wednesday Saturday  Monday Thursday Sunday  Tuesday Friday Monday  Wednesday Saturday Tuesday  Thursday Sunday Wednesday  Friday Monday Thursday  Saturday Tuesday Friday   [name] MSN, CNM Advanced Practice Provider, [facility] 08/07/2023, 10:56 PM

## 2023-08-07 NOTE — MAU Note (Signed)
..  Emily Garrison is a 38 y.o. at [redacted]w[redacted]d here in MAU reporting: from office for methotrexate. Denies pain or vaginal bleeding.   Pain score: 0/10 Vitals:   08/07/23 2230  BP: 119/71  Pulse: 71  Resp: 16  Temp: 98.7 F (37.1 C)  SpO2: 100%     FHT:n/a Lab orders placed from triage:  none

## 2023-08-07 NOTE — Progress Notes (Signed)
 Ultrasounds Results Note  SUBJECTIVE HPI:  [name] is a 38 y.o. [id number] at [redacted]w[redacted]d by LMP who presents to the [facility] for followup ultrasound results. The patient denies abdominal pain, with light  vaginal bleeding. She had a positive pregnancy test one week ago Upon review of the patient's records, patient was first seen in MAU on 08/04/23 for bleeding.   BHCG on that day was 144.  Ultrasound showed no IUP.   BHCG was 151 yesterday and 181 today.   Past Medical History:  Diagnosis Date   Anemia of pregnancy 03/12/2023   Anxiety    Breast cyst    Cyst of breast 03/12/2023   Eczema    Herpes simplex 03/12/2023   Morbid obesity (HCC) 03/12/2023   PVC (premature ventricular contraction) 05/13/2021   Vaginal Pap smear, abnormal    Past Surgical History:  Procedure Laterality Date   CESAREAN SECTION     DILATION AND EVACUATION N/A 03/20/2023   Procedure: DILATATION AND EVACUATION;  Surgeon: Emily Gong, MD;  Location: [facility] OR;  Service: Gynecology;  Laterality: N/A;   removal of breast cyst Left    THERAPEUTIC ABORTION  2010   Social History   Socioeconomic History   Marital status: Married    Spouse name: Not on file   Number of children: 1   Years of education: Not on file   Highest education level: Not on file  Occupational History   Not on file  Tobacco Use   Smoking status: Former    Current packs/day: 0.00    Average packs/day: 0.3 packs/day for 10.0 years (2.5 ttl pk-yrs)    Types: Cigarettes    Start date: 09/2010    Quit date: 09/2020    Years since quitting: 2.9   Smokeless tobacco: Never  Vaping Use   Vaping status: Never Used  Substance and Sexual Activity   Alcohol use: Not Currently    Comment: occasionally   Drug use: No   Sexual activity: Yes    Birth control/protection: Pill  Other Topics Concern   Not on file  Social History Narrative   ** Merged History Encounter **       Social Drivers of Health    Financial Resource Strain: Not on file  Food Insecurity: No Food Insecurity (08/07/2023)   Hunger Vital Sign    Worried About Running Out of Food in the Last Year: Never true    Ran Out of Food in the Last Year: Never true  Transportation Needs: No Transportation Needs (08/07/2023)   PRAPARE - Administrator, Civil Service (Medical): No    Lack of Transportation (Non-Medical): No  Physical Activity: Not on file  Stress: Not on file  Social Connections: Not on file  Intimate Partner Violence: Not on file   Current Outpatient Medications on File Prior to Visit  Medication Sig Dispense Refill   Prenatal Vit-Fe Fumarate-FA (MULTIVITAMIN-PRENATAL) 27-0.8 MG TABS tablet Take 1 tablet by mouth daily at 12 noon. 30 tablet 11   No current facility-administered medications on file prior to visit.   Allergies  Allergen Reactions   Latex Hives and Other (See Comments)    I have reviewed patient's Past Medical Hx, Surgical Hx, Family Hx, Social Hx, medications and allergies.   Review of Systems Review of Systems  Constitutional: Negative for fever and chills.  Gastrointestinal: Negative for nausea, vomiting, abdominal pain, diarrhea and constipation.  Genitourinary: Negative for dysuria.  Musculoskeletal: Negative for back  pain.  Neurological: Negative for dizziness and weakness.    Physical Exam  BP 119/75   Pulse 73   Wt (!) 333 lb 3.2 oz (151.1 kg)   LMP 06/25/2023 (Exact Date)   BMI 57.19 kg/m   GENERAL: Well-developed, well-nourished female in no acute distress.  HEENT: Normocephalic, atraumatic.   LUNGS: Effort normal ABDOMEN: soft, non-tender HEART: Regular rate  SKIN: Warm, dry and without erythema PSYCH: Normal mood and affect NEURO: Alert and oriented x 4  LAB RESULTS Results for orders placed or performed in visit on 08/07/23 (from the past 24 hours)  Beta hCG quant (ref lab)     Status: None   Collection Time: 08/07/23  9:53 AM  Result Value Ref  Range   hCG Quant 181 mIU/mL   Narrative   Performed at:  01 - [facility] [address], [place], [place]  [id number] Lab Director: Emily Dross MD, Phone:  [phone]    IMAGING US  OB LESS THAN 14 WEEKS WITH OB TRANSVAGINAL Result Date: 08/04/2023 CLINICAL DATA:  Vaginal bleeding for few days. EXAM: OBSTETRIC <14 WK US  AND TRANSVAGINAL OB US  TECHNIQUE: Both transabdominal and transvaginal ultrasound examinations were performed for complete evaluation of the gestation as well as the maternal uterus, adnexal regions, and pelvic cul-de-sac. Transvaginal technique was performed to assess early pregnancy. COMPARISON:  March 17, 2023 FINDINGS: Intrauterine gestational sac: None Yolk sac:  Not Visualized. Embryo:  Not Visualized. Cardiac Activity: Not Visualized. Heart Rate: N/A  bpm Maternal uterus/adnexae: The endometrium measures 12 mm in thickness. The right ovary measures 3.4 cm x 2.0 cm x 2.6 cm and is normal in appearance. The left ovary measures 2.8 cm x 2.9 cm x 3.2 cm and is normal in appearance. No pelvic free fluid is identified. IMPRESSION: No evidence of an intrauterine pregnancy. Correlation with follow-up pelvic ultrasound and serial beta HCG levels is recommended if this remains of clinical concern. Electronically Signed   By: Emily Garrison M.D.   On: 08/04/2023 16:42    ASSESSMENT 1. Pregnancy of unknown anatomic location     PLAN Discharge home in stable condition She understands this is a failed/abnormal gestation and wants active management and accepts MTX and will go to MAU possibly tonight  Emily Lynwood MATSU, MD  08/07/2023  2:54 PM

## 2023-08-10 ENCOUNTER — Inpatient Hospital Stay (HOSPITAL_COMMUNITY)
Admission: AD | Admit: 2023-08-10 | Discharge: 2023-08-10 | Disposition: A | Discharge status: Home / Self Care | Attending: Obstetrics & Gynecology | Admitting: Obstetrics & Gynecology

## 2023-08-10 ENCOUNTER — Encounter (HOSPITAL_COMMUNITY): Payer: Self-pay | Admitting: Obstetrics & Gynecology

## 2023-08-10 ENCOUNTER — Ambulatory Visit: Payer: Self-pay | Admitting: Family Medicine

## 2023-08-10 DIAGNOSIS — Z3A01 Less than 8 weeks gestation of pregnancy: Secondary | ICD-10-CM | POA: Diagnosis not present

## 2023-08-10 DIAGNOSIS — O009 Unspecified ectopic pregnancy without intrauterine pregnancy: Secondary | ICD-10-CM | POA: Diagnosis present

## 2023-08-10 LAB — HCG, QUANTITATIVE, PREGNANCY: hCG, Beta Chain, Quant, S: 175 m[IU]/mL — ABNORMAL HIGH (ref <5)

## 2023-08-10 NOTE — MAU Provider Note (Signed)
 History   Chief Complaint:  Labs Only   Emily Garrison is  38 y.o. [phone] Patient's last menstrual period was 06/25/2023 (exact date).. Patient is here for follow up of quantitative HCG and ongoing surveillance of pregnancy status. She is [redacted]w[redacted]d weeks gestation by LMP.    Since her last visit, the patient is without new complaint. The patient reports bleeding as  none now.  She denies any pain.  General ROS:  negative  Her previous Quantitative HCG values are:  Recent Labs   Lab 08/06/23  08/07/23  [id number] 157* 181*   Physical Exam   Blood pressure 132/70, pulse (!) 101, temperature 98.8 F (37.1 C), temperature source Oral, resp. rate 16, height 5' 4 (1.626 m), weight (!) 152.6 kg, last menstrual period 06/25/2023, SpO2 100%.  Focused Gynecological Exam: examination not indicated  Labs: Results for orders placed or performed during the hospital encounter of 08/10/23 (from the past 24 hours)  hCG, quantitative, pregnancy   Collection Time: 08/10/23  8:04 PM  Result Value Ref Range   hCG, Beta Chain, Quant, S 175 (H) <5 mIU/mL    Ultrasound Studies:   US  OB LESS THAN 14 WEEKS WITH OB TRANSVAGINAL Result Date: 08/04/2023 CLINICAL DATA:  Vaginal bleeding for few days. EXAM: OBSTETRIC <14 WK US  AND TRANSVAGINAL OB US  TECHNIQUE: Both transabdominal and transvaginal ultrasound examinations were performed for complete evaluation of the gestation as well as the maternal uterus, adnexal regions, and pelvic cul-de-sac. Transvaginal technique was performed to assess early pregnancy. COMPARISON:  March 17, 2023 FINDINGS: Intrauterine gestational sac: None Yolk sac:  Not Visualized. Embryo:  Not Visualized. Cardiac Activity: Not Visualized. Heart Rate: N/A  bpm Maternal uterus/adnexae: The endometrium measures 12 mm in thickness. The right ovary measures 3.4 cm x 2.0 cm x 2.6 cm and is normal in appearance. The left ovary measures 2.8 cm x 2.9 cm x 3.2 cm and is normal in  appearance. No pelvic free fluid is identified. IMPRESSION: No evidence of an intrauterine pregnancy. Correlation with follow-up pelvic ultrasound and serial beta HCG levels is recommended if this remains of clinical concern. Electronically Signed   By: Suzen Dials M.D.   On: 08/04/2023 16:42    Assessment:   1. Ectopic pregnancy, unspecified location, unspecified whether intrauterine pregnancy present   2. [redacted] weeks gestation of pregnancy       Plan: -Discharge home in stable condition -MTX injection precautions discussed -Patient advised to follow-up with [facility] on Wednesday 08/13/2023 -Patient may return to MAU as needed or if her condition were to change or worsen  Ala Cart, CNM 08/10/2023, 9:56 PM

## 2023-08-10 NOTE — MAU Note (Signed)
 MAU Triage Note:  [name] is a 38 y.o. at [redacted]w[redacted]d here in MAU reporting: here for repeat hCG after receiving methotrexate . Does report gas pain yesterday, but denies pain today. Denies bleeding or any other complaints.  Patient complaint: hcg levels  Pain Score: 0-No pain     Onset of complaint: today LMP: Patient's last menstrual period was 06/25/2023 (exact date).  Vitals:   08/10/23 1958  BP: 132/70  Pulse: (!) 101  Resp: 16  Temp: 98.8 F (37.1 C)  SpO2: 100%    Lab orders placed from triage: quant hCG

## 2023-08-13 ENCOUNTER — Other Ambulatory Visit: Payer: Self-pay

## 2023-08-13 ENCOUNTER — Encounter: Payer: Self-pay | Admitting: *Deleted

## 2023-08-13 ENCOUNTER — Ambulatory Visit (INDEPENDENT_AMBULATORY_CARE_PROVIDER_SITE_OTHER): Payer: Self-pay | Admitting: *Deleted

## 2023-08-13 VITALS — BP 129/77 | HR 68 | Ht 64.0 in | Wt 336.5 lb

## 2023-08-13 DIAGNOSIS — O009 Unspecified ectopic pregnancy without intrauterine pregnancy: Secondary | ICD-10-CM

## 2023-08-13 DIAGNOSIS — Z3A01 Less than 8 weeks gestation of pregnancy: Secondary | ICD-10-CM

## 2023-08-13 LAB — BETA HCG QUANT (REF LAB): hCG Quant: 26 m[IU]/mL

## 2023-08-13 NOTE — Progress Notes (Signed)
 Pt presents for stat BHCG on [employer] #7 following methotrexate  injection. She reports having no pain and light bleeding. BHCG drawn and pt advised she will be notified of results today and next steps in care. She was instructed to return to [facility] if she develops heavy vaginal bleeding or onset of abdominal pain. Pt voiced understanding.  1515  BHCG results (26) reviewed by Dr. Cresenzo who finds this is an appropriate decrease. He recommends non-stat BHCG in one week. Pt was contacted and informed of results as well as need for lab draw next week - scehduled 7/11 @ 8:50 am. She stated that after leaving our office she passed something that looked like a sac or tissue. Pt forwarded the image via Mychart message. I reviewed the image and agreed that it may be a sac/products of conception. She will go to [facility] if bleeding increases. Pt voiced understanding of all information and instructions given.

## 2023-08-14 NOTE — Addendum Note (Signed)
 Addended by: MICHAE LOA CROME on: 08/14/2023 02:33 PM   Modules accepted: Orders

## 2023-08-22 ENCOUNTER — Other Ambulatory Visit

## 2023-08-22 ENCOUNTER — Other Ambulatory Visit: Payer: Self-pay

## 2023-08-22 DIAGNOSIS — O009 Unspecified ectopic pregnancy without intrauterine pregnancy: Secondary | ICD-10-CM

## 2023-08-23 LAB — BETA HCG QUANT (REF LAB): hCG Quant: <1 m[IU]/mL

## 2023-08-25 ENCOUNTER — Ambulatory Visit: Payer: Self-pay | Admitting: Family Medicine

## 2023-08-25 DIAGNOSIS — O009 Unspecified ectopic pregnancy without intrauterine pregnancy: Secondary | ICD-10-CM | POA: Insufficient documentation

## 2023-08-27 ENCOUNTER — Other Ambulatory Visit

## 2023-09-10 ENCOUNTER — Telehealth
